# Patient Record
Sex: Female | Born: 1950 | Race: White | Hispanic: No | Smoking: Never smoker
Health system: Southern US, Community
[De-identification: ages and names within clinical notes are randomized; demographics above are authoritative.]

## PROBLEM LIST (undated history)

## (undated) DIAGNOSIS — R0602 Shortness of breath: Secondary | ICD-10-CM

## (undated) DIAGNOSIS — I10 Essential (primary) hypertension: Secondary | ICD-10-CM

## (undated) DIAGNOSIS — E7800 Pure hypercholesterolemia, unspecified: Secondary | ICD-10-CM

## (undated) DIAGNOSIS — J302 Other seasonal allergic rhinitis: Secondary | ICD-10-CM

## (undated) HISTORY — DX: Other seasonal allergic rhinitis: J30.2

## (undated) HISTORY — DX: Essential (primary) hypertension: I10

## (undated) HISTORY — DX: Pure hypercholesterolemia, unspecified: E78.00

## (undated) HISTORY — DX: Shortness of breath: R06.02

---

## 1999-06-09 LAB — PULMONARY FUNCTION TEST

## 2002-01-28 ENCOUNTER — Encounter: Payer: Self-pay | Admitting: Family Medicine

## 2002-01-28 ENCOUNTER — Ambulatory Visit (HOSPITAL_COMMUNITY): Admission: RE | Admit: 2002-01-28 | Discharge: 2002-01-28 | Payer: Self-pay | Admitting: Family Medicine

## 2002-02-04 ENCOUNTER — Encounter: Payer: Self-pay | Admitting: Family Medicine

## 2002-02-04 ENCOUNTER — Ambulatory Visit (HOSPITAL_COMMUNITY): Admission: RE | Admit: 2002-02-04 | Discharge: 2002-02-04 | Payer: Self-pay | Admitting: Family Medicine

## 2007-05-17 HISTORY — PX: OTHER SURGICAL HISTORY: SHX169

## 2007-10-25 ENCOUNTER — Ambulatory Visit (HOSPITAL_COMMUNITY): Admission: RE | Admit: 2007-10-25 | Discharge: 2007-10-25 | Payer: Self-pay | Admitting: Family Medicine

## 2008-01-22 ENCOUNTER — Ambulatory Visit: Payer: Self-pay | Admitting: Thoracic Surgery

## 2008-03-25 ENCOUNTER — Ambulatory Visit: Payer: Self-pay | Admitting: Thoracic Surgery

## 2008-03-25 ENCOUNTER — Inpatient Hospital Stay (HOSPITAL_COMMUNITY): Admission: RE | Admit: 2008-03-25 | Discharge: 2008-03-29 | Payer: Self-pay | Admitting: Thoracic Surgery

## 2008-03-25 ENCOUNTER — Encounter: Payer: Self-pay | Admitting: Thoracic Surgery

## 2008-04-03 ENCOUNTER — Encounter: Admission: RE | Admit: 2008-04-03 | Discharge: 2008-04-03 | Payer: Self-pay | Admitting: Thoracic Surgery

## 2008-04-03 ENCOUNTER — Ambulatory Visit: Payer: Self-pay | Admitting: Thoracic Surgery

## 2008-04-23 ENCOUNTER — Encounter: Admission: RE | Admit: 2008-04-23 | Discharge: 2008-04-23 | Payer: Self-pay | Admitting: Thoracic Surgery

## 2008-04-23 ENCOUNTER — Ambulatory Visit: Payer: Self-pay | Admitting: Thoracic Surgery

## 2008-06-04 ENCOUNTER — Ambulatory Visit: Payer: Self-pay | Admitting: Thoracic Surgery

## 2008-06-04 ENCOUNTER — Encounter: Admission: RE | Admit: 2008-06-04 | Discharge: 2008-06-04 | Payer: Self-pay | Admitting: Thoracic Surgery

## 2008-07-30 ENCOUNTER — Encounter: Admission: RE | Admit: 2008-07-30 | Discharge: 2008-07-30 | Payer: Self-pay | Admitting: Thoracic Surgery

## 2008-07-30 ENCOUNTER — Ambulatory Visit: Payer: Self-pay | Admitting: Thoracic Surgery

## 2008-11-26 ENCOUNTER — Ambulatory Visit: Payer: Self-pay | Admitting: Thoracic Surgery

## 2008-11-26 ENCOUNTER — Encounter: Admission: RE | Admit: 2008-11-26 | Discharge: 2008-11-26 | Payer: Self-pay | Admitting: Thoracic Surgery

## 2009-01-18 ENCOUNTER — Ambulatory Visit: Payer: Self-pay | Admitting: Specialist

## 2009-01-20 IMAGING — CR DG CHEST 2V
2 series · 2 of 2 positions shown · non-contrast
Comparison: 04/23/2008

CLINICAL DATA: Shortness of breath, pericardial cyst resection

CHEST - 2 VIEW

[w chest pa]
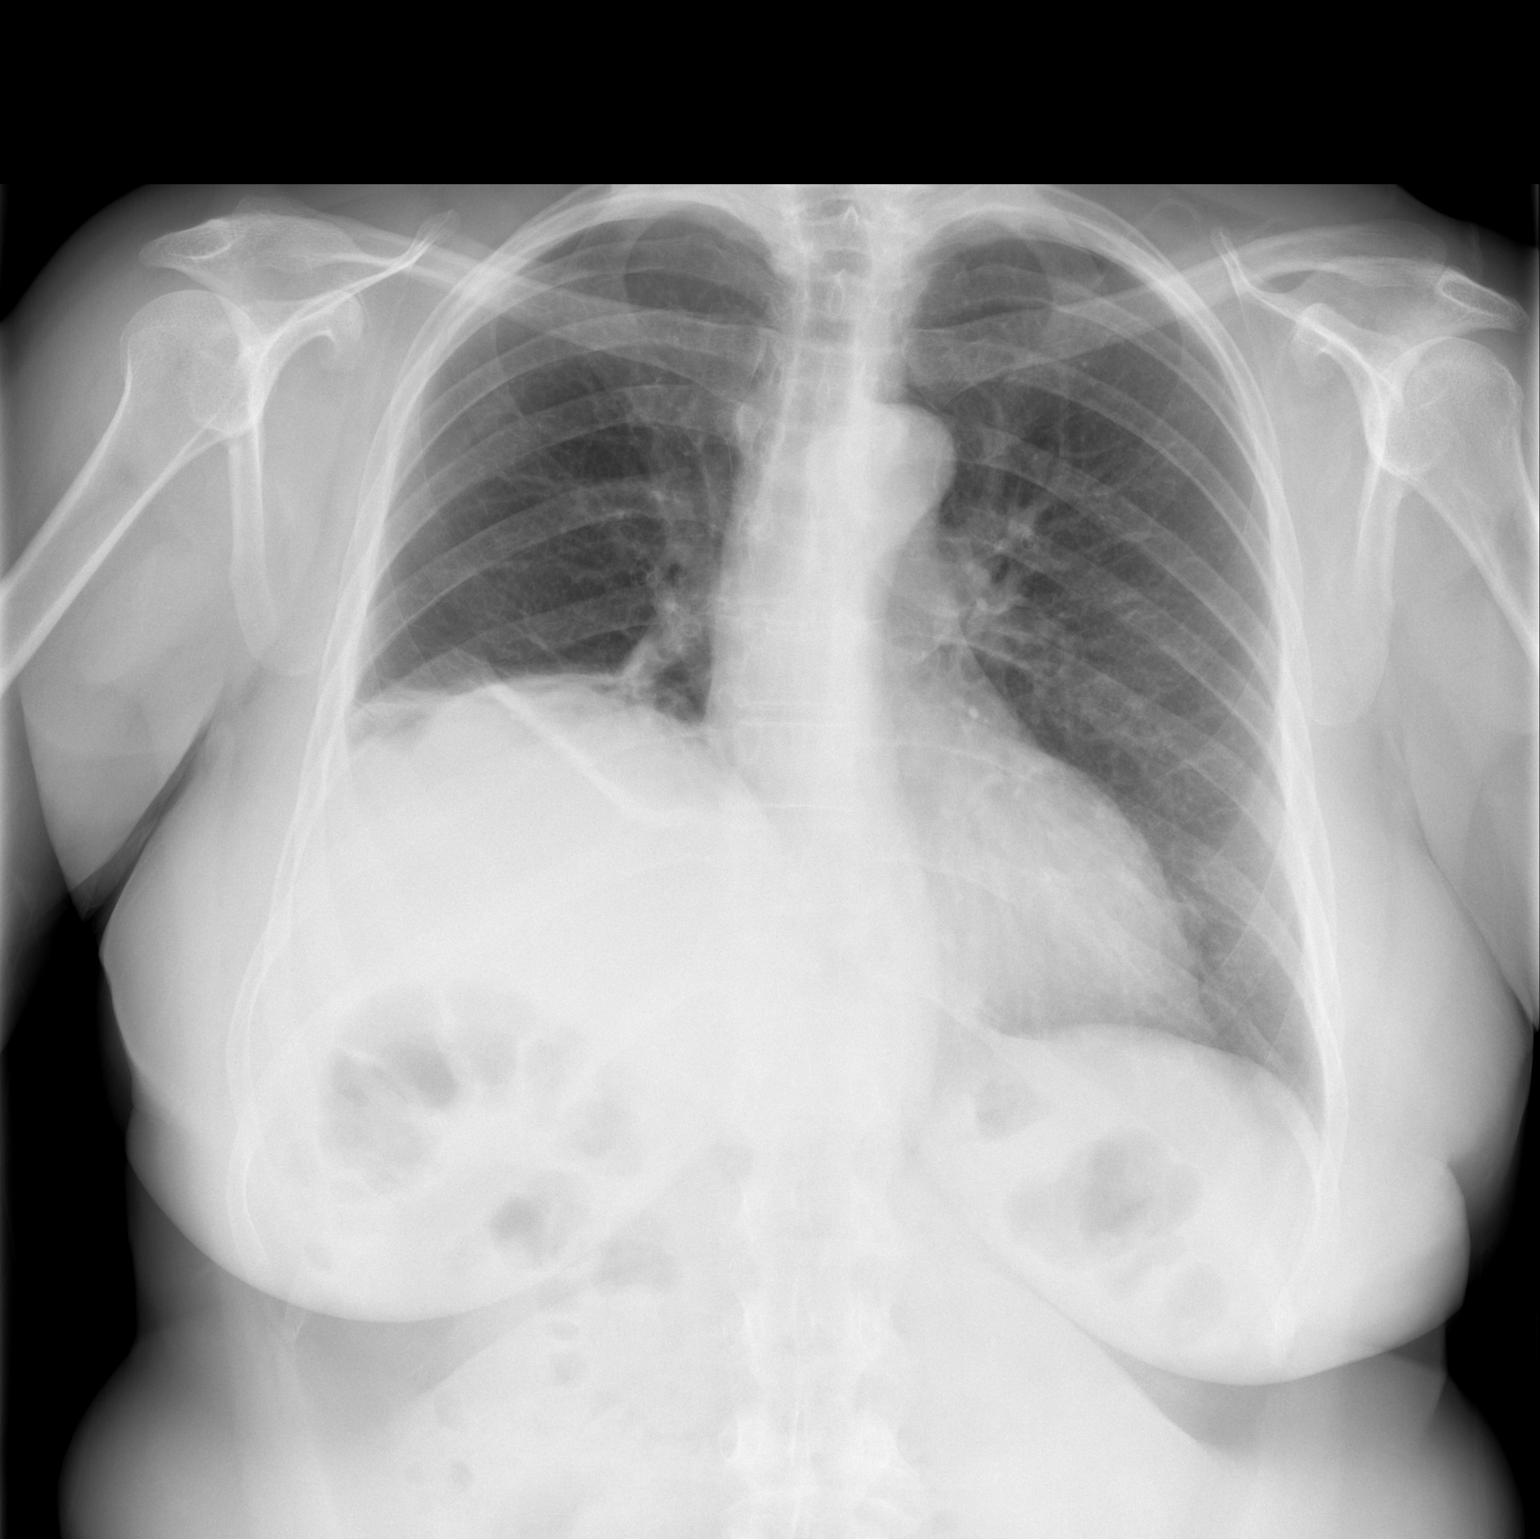

[w chest lat]
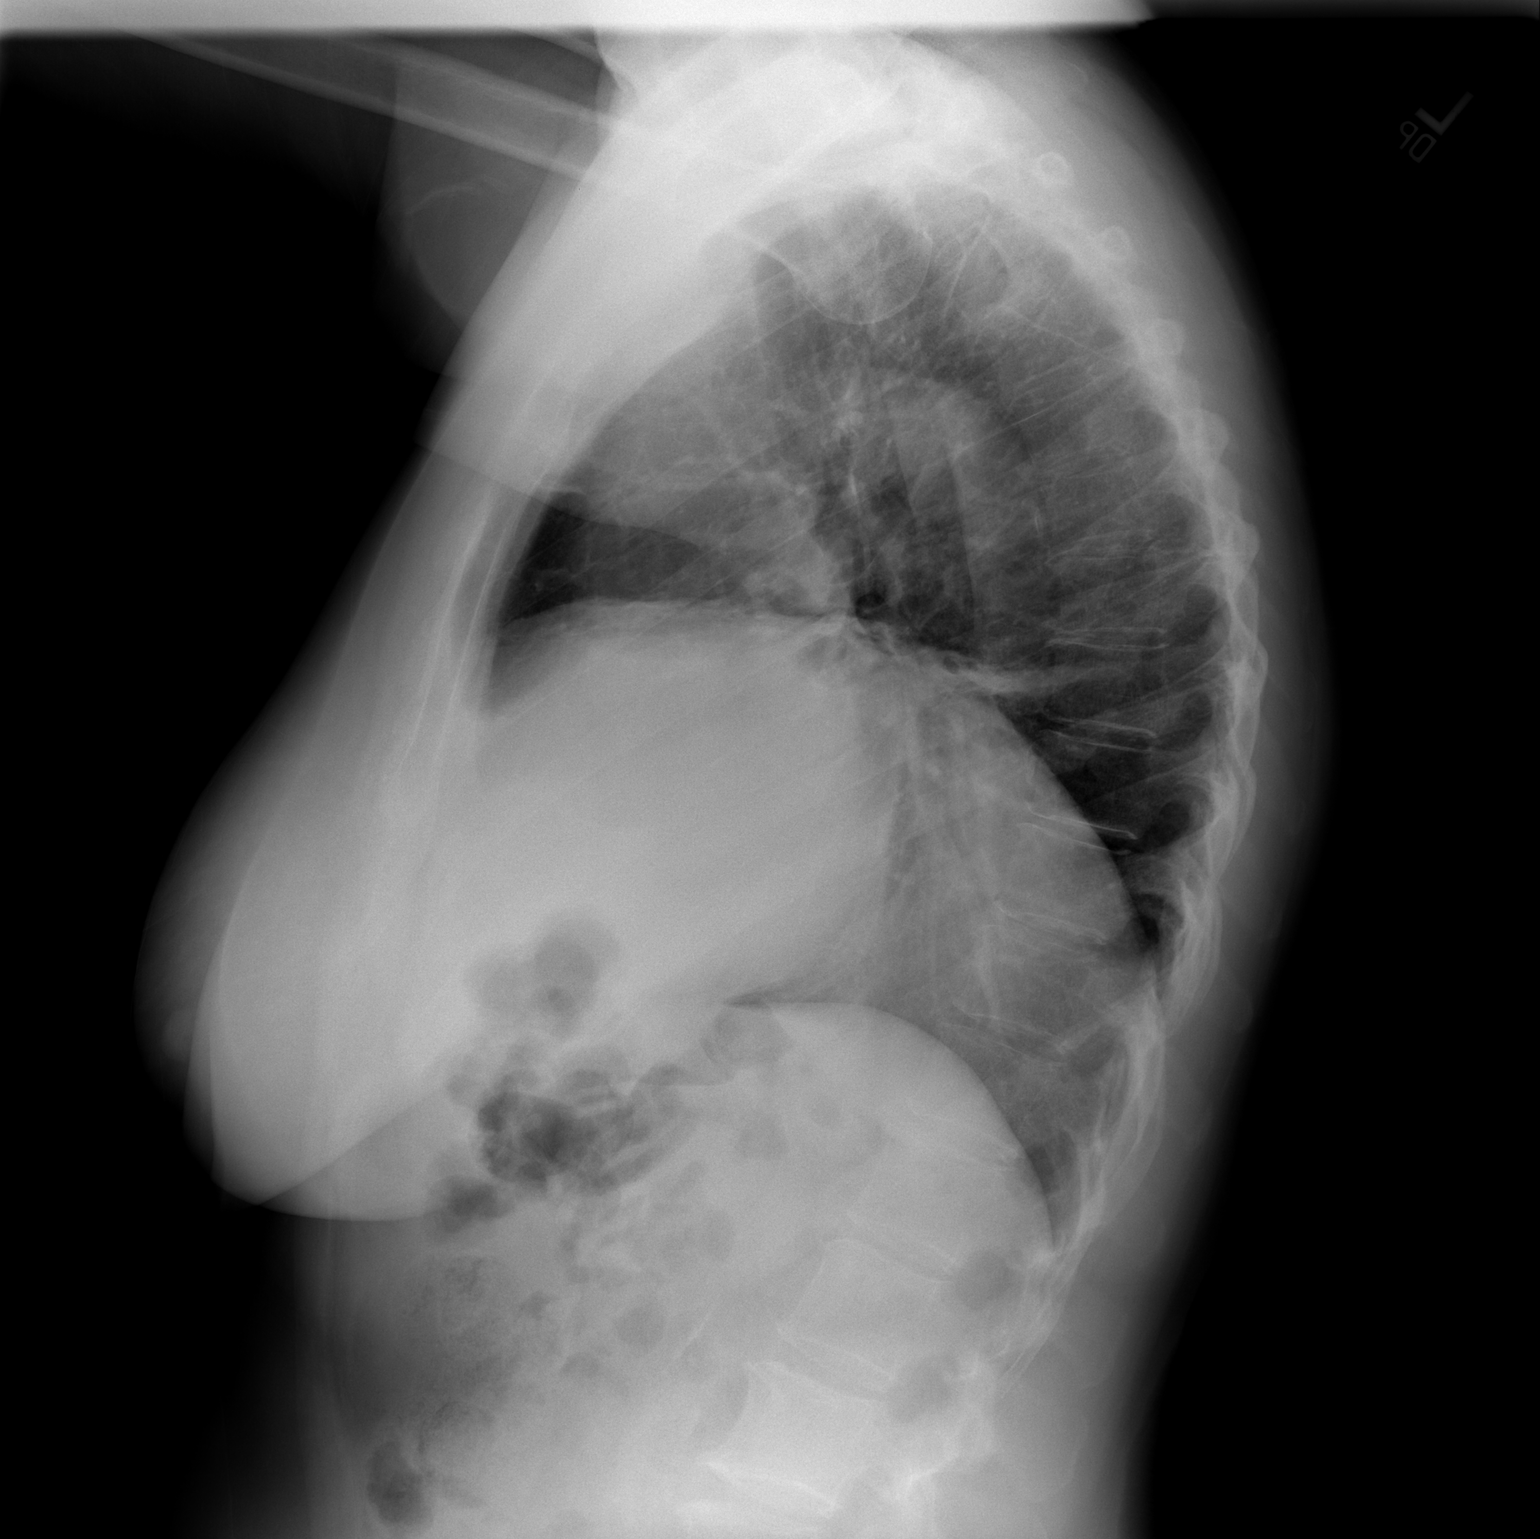

[2 of 2 positions shown; findings below may reference images not displayed]

FINDINGS: Heart size is normal.  Increased plate-like right lower
lobe atelectasis and trace right effusion/ pleural thickening are
noted.  The left lung is clear.  Heart size is normal.  No new
finding otherwise.
IMPRESSION: Increased plate-like right lower lobe atelectasis and trace right
effusion/pleural thickening.

## 2009-02-17 ENCOUNTER — Ambulatory Visit: Payer: Self-pay | Admitting: Internal Medicine

## 2009-02-19 ENCOUNTER — Ambulatory Visit: Payer: Self-pay | Admitting: Internal Medicine

## 2009-02-23 ENCOUNTER — Ambulatory Visit: Payer: Self-pay | Admitting: Internal Medicine

## 2009-02-26 ENCOUNTER — Ambulatory Visit: Payer: Self-pay | Admitting: Internal Medicine

## 2009-03-03 ENCOUNTER — Encounter: Payer: Self-pay | Admitting: Internal Medicine

## 2009-03-16 ENCOUNTER — Encounter: Payer: Self-pay | Admitting: Internal Medicine

## 2009-04-21 ENCOUNTER — Ambulatory Visit: Payer: Self-pay | Admitting: Internal Medicine

## 2009-04-28 ENCOUNTER — Ambulatory Visit: Payer: Self-pay | Admitting: Internal Medicine

## 2009-05-05 ENCOUNTER — Ambulatory Visit: Payer: Self-pay | Admitting: Internal Medicine

## 2009-05-19 ENCOUNTER — Ambulatory Visit: Payer: Self-pay | Admitting: Internal Medicine

## 2009-05-25 ENCOUNTER — Ambulatory Visit: Payer: Self-pay | Admitting: Internal Medicine

## 2009-06-01 ENCOUNTER — Ambulatory Visit: Payer: Self-pay | Admitting: Internal Medicine

## 2009-06-03 ENCOUNTER — Ambulatory Visit: Payer: Self-pay | Admitting: Thoracic Surgery

## 2009-06-03 ENCOUNTER — Encounter: Admission: RE | Admit: 2009-06-03 | Discharge: 2009-06-03 | Payer: Self-pay | Admitting: Thoracic Surgery

## 2009-09-05 ENCOUNTER — Emergency Department (HOSPITAL_COMMUNITY): Admission: EM | Admit: 2009-09-05 | Discharge: 2009-09-05 | Payer: Self-pay | Admitting: Emergency Medicine

## 2009-09-25 ENCOUNTER — Ambulatory Visit (HOSPITAL_COMMUNITY): Admission: RE | Admit: 2009-09-25 | Discharge: 2009-09-25 | Payer: Self-pay | Admitting: General Surgery

## 2009-12-02 ENCOUNTER — Ambulatory Visit: Payer: Self-pay | Admitting: Thoracic Surgery

## 2009-12-02 ENCOUNTER — Encounter: Admission: RE | Admit: 2009-12-02 | Discharge: 2009-12-02 | Payer: Self-pay | Admitting: Thoracic Surgery

## 2010-06-06 ENCOUNTER — Encounter: Payer: Self-pay | Admitting: Thoracic Surgery

## 2010-09-28 NOTE — Letter (Signed)
July 30, 2008   Thereasa Solo. Little, MD  [address], [address]  [place], [place] [address]   Re:  Alexandria Peters, Alexandria Peters              DOB:  02/28/1951   Dear Mindi Junker,   I saw the patient back today for followup.  She knows I was worried  about her feeling nerve palsy and causing elevation of her right  diaphragm and she comes today and there still some plate-like  atelectasis on chest x-ray and elevated right diaphragm, but may be not  as much as it was before.  Symptomatically, she is slightly improved  with less shortness of breath and has started an exercise program.  Her  blood pressure is 146/94, pulse 83, respirations 18, and sats are 95%.  I will plan to see her back again in 4 months with another chest x-ray  and hope she continues to improve.   Ines Bloomer, M.D.  Electronically Signed   DPB/MEDQ  D:  07/30/2008  T:  07/30/2008  Job:  [id number]   cc:   Carola Frost, D.D.S.

## 2010-09-28 NOTE — Letter (Signed)
April 23, 2008   Thereasa Solo. Little, MD  [address], [address]  [place], [place] [address]   Re:  Alexandria Peters, Alexandria Peters              DOB:  [birth year]   Dear Dr. Clarene [facility]:   I saw the patient back today.  Her incisions are well healed after her  excision of a pericardial cyst.  The right diaphragm is still elevated,  but she is breathing better and I think, it is actually may be the right  diaphragm, which has improved somewhat.  Whatever it is, we will plan to  see her back again and her lungs were also clear to auscultation and  percussion.  Her blood pressure is 148/93, pulse 94, respirations 18,  and sats were 96%.  I will see her back again in 6 weeks with another  chest x-ray.   Ines Bloomer, M.D.  Electronically Signed   DPB/MEDQ  D:  04/23/2008  T:  04/23/2008  Job:  [id number]
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
## 2010-09-28 NOTE — Op Note (Signed)
Alexandria Peters, Alexandria Peters              ACCOUNT NO.:  [id number]   MEDICAL RECORD NO.:  [id number]          PATIENT TYPE:  INP   LOCATION:  3308                         FACILITY:  [facility]   PHYSICIAN:  Ines Bloomer, M.D. DATE OF BIRTH:  15-Apr-1951   DATE OF PROCEDURE:  03/25/2008  DATE OF DISCHARGE:                               OPERATIVE REPORT   PREOPERATIVE DIAGNOSIS:  Right pericardial cyst.   POSTOPERATIVE DIAGNOSIS:  Right pericardial cyst.   OPERATION PERFORMED:  Right video-assisted thoracic surgery, excision of  pericardial cyst.   SURGEON:  Ines Bloomer, MD   FIRST ASSISTANT:  Doree Fudge, PA-C   ANESTHESIA:  General anesthesia.   PROCEDURE:  After percutaneous insertion of all monitoring lines, the  patient underwent general anesthesia.  Dual-lumen tube was inserted.  The patient was turned to the right lateral thoracotomy position.  The  right lung was deflated.  Trocar site was made at the eighth intercostal  space at the posterior axillary line and trocar was inserted.  Another  trocar was made at the sixth intercostal space at the anterior axillary  line, another trocar was inserted.  Pericardial cyst was seen in the  costophrenic angle and pictures were taken of this.  Third trocar site  was made in the mid axillary line at the sixth intercostal space.  This  was just a 5-mm trocar and a grasper was inserted.  We were able to  retract the lung laterally and expose the cyst and then coming in with a  30-degree camera posterior trocar site, the section showed the  pericardial cyst through the mid trocar site.  This was enlarged  approximately 2.5 to 3 cm.  The cyst was dissected out with  electrocautery, grasping with a Kaiser ring forceps and pulling up.  Dissection was carried down to the base of this and the cyst was  excised.  A lot of epicardial fat was also excised around the cyst as  the cyst was encased with lot of epicardial fat.  After the cyst  had  been excised, a #28 right-angled chest tube was inserted and sutured in  place with 0 silk of single On-Q was inserted in the usual fashion.  A  Marcaine block was done in the usual fashion.  The chest tube was  sutured in place with 0 silk.  The other chest tubes were closed with 3-  0 Vicryl and Dermabond for the skin.  The patient was returned to the  recovery room in stable condition.      Ines Bloomer, M.D.  Electronically Signed     DPB/MEDQ  D:  03/25/2008  T:  03/25/2008  Job:  [id number]   cc:   Thereasa Solo. Little, M.D.

## 2010-09-28 NOTE — Letter (Signed)
December 02, 2009   Thereasa Solo. Little, M.D.  [address], [address]  [place], [place] [address]   Re:  Alexandria Peters, Alexandria Peters              DOB:  [birth year]   Dear Gaspar Garbe,   I saw the patient back for followup.  There is no evidence of recurrence  of her pericardial cyst.  Her right diaphragm is still elevated but may  be little less than before.  She is no more short of breath and  everything looks stable.  Her lungs were clear bilaterally.  We will  plan to see her again in 1 year with another chest x-ray for further  followup.  Her blood pressure was 148/85, pulse 62, respirations 18,  saturations were 100%.   Alexandria Peters, M.D.  Electronically Signed   DPB/MEDQ  D:  12/02/2009  T:  12/03/2009  Job:  [id number]   cc:   Esau Grew, MD

## 2010-09-28 NOTE — Discharge Summary (Signed)
Alexandria Peters, Alexandria Peters              ACCOUNT NO.:  [id number]   MEDICAL RECORD NO.:  [id number]          PATIENT TYPE:  INP   LOCATION:  3308                         FACILITY:  [facility]   PHYSICIAN:  Ines Bloomer, M.D. DATE OF BIRTH:  12-04-1950   DATE OF ADMISSION:  03/25/2008  DATE OF DISCHARGE:  03/29/2008                               DISCHARGE SUMMARY   ADMITTING DIAGNOSES:  1. Right pericardial cyst.  2. History of asbestosis.   DISCHARGE DIAGNOSES:  1. Right pericardial cyst.  2. History of asbestosis.   PROCEDURE:  Right video-assisted thoracoscopy, excision of pericardial  cyst by Dr. Edwyna Shell on March 25, 2008.  Pathology results of the  pericardial cyst, simple cystic mesothelial-line fiber adipose tissue  with chronic inflammation, malignant features not identified.   HISTORY OF PRESENTING ILLNESS:  This is a 60 year old Caucasian female  with a past medical history of asbestos exposure (since 1976) has been  followed closely by Dr. Clarene Duke.  A CAT scan of the chest done on 2001  showed a 3.6-centimeter pericardial density in the right heart border.  A more recent scan of the CT of the chest showed an increase in size of  this pericardial density.  An echocardiogram was negative as well as a  stress test.  Pulmonary function test showed an FVC of 3.4 and FEV-1 of  3.53 with an effusion capacity of 100%.  The patient was seen in  consultation in the office by Dr. Edwyna Shell on March 18, 2008.  The  patient was admitted to [facility] on March 25, 2008 to undergo a  right VATS and excision of her right pericardial cyst.   BRIEF HOSPITAL COURSE STAY:  The patient remained afebrile and  hemodynamically stable.  She was found to have hypokalemia (potassium  3.6 on postop day #1), potassium was supplemented accordingly.  The  patient was not noted to have an air leak from her chest tube.  She was  taken off suction.  Chest x-ray on postop day #2 showed a right  pneumothorax approximately 10-15% as well as an elevated right  hemidiaphragm.  The patient On-Q was discontinued on this date.  Chest  tube remained.  Followup x-ray on March 28, 2008 revealed a decrease  of the right pneumothorax to less than 5% as well as right base  atelectasis and again right hemidiaphragm elevation.  Currently, the  patient again is afebrile and hemodynamically stable.  She is tolerating  a regular diet.   PHYSICAL EXAMINATION:  CARDIOVASCULAR:  Regular rate and rhythm.  PULMONARY:  Mostly clear to auscultation bilaterally.  Slight diminished  at the right base.  ABDOMEN:  Soft, nontender.  Bowel sounds present.  Right chest wound is  clean, dry and continuing to heal.  The patient is going to have her  Foley removed today as well as her chest tube.  An x-ray was taken  immediately after removal of the chest tube followed by another chest x-  ray in the morning provided she remains afebrile and the chest x-ray  shows no new findings.  The patient will be discharged  on March 29, 2008.   Latest laboratory studies reveal last CBC done March 27, 2008, H&H  11.9 and 34.0 respectively, white count elevated to 100 and platelet  count 162,000.  CMP done on this date as well potassium 3.9.  BUN and  creatinine were 6 and 0.63 respectively.  LFTs were all within normal  limits.  Last chest x-ray done today as previously stated again showed a  decrease in the size of the right pneumothorax, (less than 5%), right  base atelectasis and elevation of the right hemidiaphragm.   DISCHARGE INSTRUCTIONS:  1. The patient is to remain on a regular diet.  2. She may shower.  3. She is not to lift or drive for 2 weeks.  4. She is to continue with her breathing exercise daily.  5. She is to walk everyday and increase her frequency and duration as      tolerated.  6. She has a followup appointment to see Dr. Edwyna Shell on April 03, 2008 at 4:30 p.m.  Prior to this  appointment, a chest x-ray will be      obtained.  Chest tube sutures will be removed at this office visit.   DISCHARGE MEDICATIONS:  1. Actifed p.o. as directed p.r.n. nasal congestion.  2. Percocet 5/325 mg one to two tablets p.o. q. 4-6 hours p.r.n. pain.      Alexandria Peters, [place]      Ines Bloomer, M.D.  Electronically Signed    DZ/MEDQ  D:  03/28/2008  T:  03/28/2008  Job:  [id number]   cc:   Dr. Clarene Duke

## 2010-09-28 NOTE — Letter (Signed)
January 22, 2008   Julieanne Manson, MD  [id number]  [address]  [place], [place]  [address]   Re:  Alexandria Peters, BORROR              DOB:  Mar 06, 1951   Dear Dr. Clarene Duke:    This 60 year old female was referred for evaluation of a pericardial  cyst.  She was initially seen by Dr. Clarene Duke.  She worked at [employer]  since 1976 and has frequent checks for asbestosis.  A CT scan in 2001  showed a pericardial density of 3.6 cm in the right heart border and  repeat in 2009 showed this increased in size to 6.3 cm consistent with  pericardial cyst.  She had no chest pain, discomfort, or questionable  palpitations.  An echocardiogram was negative and a stress test was  negative.  She has never smoked and no fever, chills, or excessive  sputum.  She has no cardiac risk factors.  Pulmonary function test  showed an FVC of the 3.4 with an FEV-1 of 3.53 and diffusion capacity of  100%.  She has had no fever, chills, or excessive sputum.   She takes Actifed for nasal congestion.   She is allergic to sulfa.   Her medical problems include asbestosis.   REVIEW OF SYSTEMS:  Her weight has been stable.  CARDIAC:  See history of present illness.  PULMONARY:  No nausea, no hemoptysis, fever, chills, or excessive  sputum.  GU:  No kidney disease, dysuria, or frequent urination.  GI:  No nausea, vomiting, constipation, diarrhea, and no GERD.  MUSCULOSKELETAL:  No arthritis or rash.  NEUROLOGICAL:  No dizziness, headaches, blackouts, or seizures.  NEUROLOGICAL:  No problems with bleeding, clotting disorders, or anemia.  PSYCHIATRIC:  No nervousness or depression.  VASCULAR:  No claudication, DVT, TIAs.   PHYSICAL EXAMINATION:  Vital Signs:  Her blood pressure was 150/97,  pulse 77, respirations 18, and sats were 99%.  Head, Eyes, Ears, Nose,  and Throat:  Unremarkable.  Neck:  Supple without thyromegaly.  There is  no supraclavicular or axillary adenopathy.  Chest:  Clear to  auscultation and  percussion.  Heart:  Regular sinus rhythm.  No murmurs.  Abdomen:  Soft.  No hepatosplenomegaly.  Pulses are 2+.  There is no  clubbing or edema.  Neurological:  She is oriented x3.  Sensory and  motor are intact.   I have discussed the risks of an enlarging pericardial cyst.  The risks  of infection, questionable bleeding, and inflammation.  I have  recommended she have a VATS removal of this.  She agrees to the surgery.  Unfortunately her CT scans were not sent with her, so I have to review  them before making a final decision, but we will tentatively plan VATS  removal of her pericardial cyst in mid October 2009.   Ines Bloomer, M.D.  Electronically Signed   DPB/MEDQ  D:  01/22/2008  T:  01/23/2008  Job:  [id number]

## 2010-09-28 NOTE — Letter (Signed)
November 26, 2008   Kirk Ruths, MD  [address]  [place], [place] [address]   Re:  Alexandria Peters, Alexandria Peters              DOB:  1950/11/22   Dear Dr. Regino Schultze:   I saw the patient back today.  She is now about 6 months since resection  of her pericardial cyst.  She is breathing better.  Her lungs are clear  to auscultation and percussion.  Her blood pressure is 147/90, pulse 74,  respirations 18, sats were 98%.  We still see an elevation of the right  diaphragm, so we will have to continue to watch this, but  symptomatically she is much improved.  I will see her back again in 6  months with a chest x-ray.   Sincerely,   Ines Bloomer, M.D.  Electronically Signed   DPB/MEDQ  D:  11/26/2008  T:  11/27/2008  Job:  [id number]   cc:   Thereasa Solo. Little, M.D.

## 2010-09-28 NOTE — Letter (Signed)
April 03, 2008   Thereasa Solo. Little, MD  [address]  [place], [place] [address]   Re:  BRITANNY, MARKSBERRY              DOB:  12-Feb-1951   Dear Roxana Hires,   The patient returned today.  Her chest x-ray still shows elevation of  her right diaphragm.  I am worried about a possible clinic nerve  dysfunction.  She is really asymptomatic.  Her blood pressure is 147/94,  pulse 99, respirations 18, and sats are 94%.  We will plan to return to  work on April 14, 2008.  Today, her incision is  well healed except  for 1 chest tube site, it had some erythema on it, but should heal  without a problem.  Hopefully, she will gradually increase her  activities and I will plan to see her back again in 3 weeks with another  chest x-ray.   Ines Bloomer, M.D.  Electronically Signed   DPB/MEDQ  D:  04/03/2008  T:  04/04/2008  Job:  [id number]

## 2010-09-28 NOTE — H&P (Signed)
NAMEJONTE, Alexandria Peters              ACCOUNT NO.:  [id number]   MEDICAL RECORD NO.:  [id number]          PATIENT TYPE:  INP   LOCATION:  NA                           FACILITY:  [facility]   PHYSICIAN:  Ines Bloomer, M.D. DATE OF BIRTH:  1950-05-24   DATE OF ADMISSION:  DATE OF DISCHARGE:                              HISTORY & PHYSICAL   CHIEF COMPLAINT:  Right heart density.   This is a 60 year old patient who has been seen by Dr. Julieanne Manson and  was being followed since 1976, for asbestos because of working with  [employer].  A CT scan in 2001, showed a 3.6 pericardial density in the right  heart border and this was repeated in 2009, that showed increase in size  to 6.3 consistent with a pericardial cyst.  She has no chest pain or  discomfort.  An echocardiogram was negative and a stress test was  negative.  She has never smoked, had no fever, chills, or excessive  sputum.  She has no cardiac risk factors.  Pulmonary function tests  showed an FVC of 3.4 with an FEV1 of 3.53 and an effusion capacity of  100%.   MEDICAL HISTORY:  She takes Actifed for nasal congestion.   She is allergic to SULFA.   She said she has had medical problems because of asbestos exposure.   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  Does not smoke or drink.   REVIEW OF SYSTEMS:  VITAL SIGNS:  She is 178 pounds.  She is 5 feet 6  inches.  GENERAL:  Her weight has been stable.  CARDIAC:  She has shortness of breath with exertion.  PULMONARY:  She had a productive cough.  GI:  No nausea, vomiting, constipation, or diarrhea.  GU:  No kidney disease, frequent urination.  VASCULAR:  No claudication, DVT, or TIAs.  NEUROLOGIC:  No dizziness, headaches, blackouts, or seizures.  MUSCULOSKELETAL:  No arthritis.  PSYCHIATRIC:  No psychiatric illness.  ENT:  No change in her eyesight or hearing.  HEMATOLOGIC:  No problems with bleeding, clotting disorders, or anemia.   PHYSICAL EXAMINATION:  GENERAL:  She is a well-developed Caucasian  female in no acute distress.  VITAL SIGNS:  Her blood pressure was 150/97, pulse 77, respirations 18,  sats were 99%.  HEENT:  Head, eyes, ears, nose, and throat; head is atraumatic.  Eyes,  pupils are equal, round, and reactive to light and accommodation.  Extraocular movements are normal.  Ears, tympanic membranes are intact.  Nose, there is no septal deviation.  Throat without lesion.  NECK:  Supple without thyromegaly.  There is no supraclavicular or  axillary adenopathy.  CHEST:  Clear to percussion.  HEART:  Regular sinus rhythm.  No murmurs.  ABDOMEN:  Soft.  No hepatosplenomegaly.  EXTREMITIES:  Pulses are 2+.  There is no clubbing or edema.  NEUROLOGIC:  He is oriented x3.  Sensory and motor are intact.  Cranial  nerves are intact.   IMPRESSION:  1. Enlarging pericardial cyst.  2. Allergies.   PLAN:  VATS  removal of right pericardial cyst.      Ines Bloomer, M.D.  Electronically Signed     DPB/MEDQ  D:  03/18/2008  T:  03/19/2008  Job:  [id number]

## 2010-09-28 NOTE — Letter (Signed)
June 04, 2008   Thereasa Solo. Little, MD  [address]  [address]  [place], [place] [address]   Re:  Alexandria Peters, Alexandria Peters              DOB:  13-Jul-1950   Dear Mindi Junker:   I saw the patient back today.  Her incisions are well healed.  As you  know, we did her surgery in mid November.  She comes back today and her  chest x-ray still shows an elevated right diaphragm, so I feel she prior  may had some possible injury to her right phrenic nerve, which we  discussed today.  Chest x-ray showed some right lower lobe atelectasis.  She has no pain, but does get shortness of breath with exertion.  She  will start an exercise program and I plan to see her back again in 2  months and hopefully there will be some improvement.  Her blood pressure  was 140/94, pulse 96, respirations 18, and sats were 96%.   Sincerely,   Ines Bloomer, M.D.  Electronically Signed   DPB/MEDQ  D:  06/04/2008  T:  06/04/2008  Job:  [id number]

## 2010-11-30 ENCOUNTER — Other Ambulatory Visit: Payer: Self-pay | Admitting: Thoracic Surgery

## 2010-11-30 DIAGNOSIS — J90 Pleural effusion, not elsewhere classified: Secondary | ICD-10-CM

## 2010-12-01 ENCOUNTER — Ambulatory Visit
Admission: RE | Admit: 2010-12-01 | Discharge: 2010-12-01 | Disposition: A | Discharge status: Home / Self Care | Payer: 59 | Source: Ambulatory Visit | Attending: Thoracic Surgery | Admitting: Thoracic Surgery

## 2010-12-01 ENCOUNTER — Ambulatory Visit (INDEPENDENT_AMBULATORY_CARE_PROVIDER_SITE_OTHER): Payer: 59 | Admitting: Thoracic Surgery

## 2010-12-01 DIAGNOSIS — Q791 Other congenital malformations of diaphragm: Secondary | ICD-10-CM

## 2010-12-01 DIAGNOSIS — J90 Pleural effusion, not elsewhere classified: Secondary | ICD-10-CM

## 2010-12-01 NOTE — Assessment & Plan Note (Signed)
OFFICE VISIT  AMANEE, IACOVELLI DOB:  1950/06/03                                        December 01, 2010 CHART #:  [id number]  I saw the patient back today.  Her blood pressure is 131/89, pulse 80, respirations 18, and sats were 99%.  Chest x-ray still shows some elevation of a right diaphragm.  This has not changed from the last year or two.  It is somewhat little more than what it was prior to removing the pericardial cyst, but it is down stabilized, she probably does have some chronic nerve dysfunction, but she has some short of breath with activity but this fully had not changed.  I will be happy to see her again if she has any worsening problems regarding her elevated right diaphragm, but I would imagine that using the right side once stabilizes, there is using a long-term problems.  I appreciate the opportunity of seeing the patient.  Ines Bloomer, M.D. Electronically Signed  DPB/MEDQ  D:  12/01/2010  T:  12/01/2010  Job:  [id number]  cc:   Truddie Coco Dr. Regino Schultze

## 2011-02-15 LAB — URINALYSIS, ROUTINE W REFLEX MICROSCOPIC
Bilirubin Urine: NEGATIVE
Glucose, UA: NEGATIVE
Ketones, ur: NEGATIVE
Nitrite: NEGATIVE
Protein, ur: NEGATIVE
Specific Gravity, Urine: 1.005
Urobilinogen, UA: 0.2

## 2011-02-15 LAB — CBC
HCT: 34.8 — ABNORMAL LOW
HCT: 41.2
Hemoglobin: 11.9 — ABNORMAL LOW
Hemoglobin: 14
MCHC: 34.1
MCV: 93.5
MCV: 93.6
MCV: 94.7
Platelets: 162
Platelets: 182
Platelets: 227
RBC: 3.68 — ABNORMAL LOW
RBC: 3.73 — ABNORMAL LOW
WBC: 8.1
WBC: 8.6

## 2011-02-15 LAB — COMPREHENSIVE METABOLIC PANEL
AST: 21
Albumin: 3 — ABNORMAL LOW
Alkaline Phosphatase: 51
Alkaline Phosphatase: 71
BUN: 6
CO2: 20
CO2: 26
Calcium: 9.4
Chloride: 101
Chloride: 105
Creatinine, Ser: 0.75
GFR calc Af Amer: >60
GFR calc non Af Amer: >60
Glucose, Bld: 102 — ABNORMAL HIGH
Potassium: 3.9
Sodium: 136
Total Bilirubin: 0.5

## 2011-02-15 LAB — BLOOD GAS, ARTERIAL
Acid-base deficit: 0.4
Bicarbonate: 23.5
FIO2: 0.21
O2 Saturation: 98.7
Patient temperature: 98.6
pCO2 arterial: 37
pO2, Arterial: 106 — ABNORMAL HIGH

## 2011-02-15 LAB — BASIC METABOLIC PANEL
BUN: 6
Calcium: 8.2 — ABNORMAL LOW
Creatinine, Ser: 0.73
GFR calc Af Amer: >60
GFR calc non Af Amer: >60

## 2011-05-17 DIAGNOSIS — E7800 Pure hypercholesterolemia, unspecified: Secondary | ICD-10-CM

## 2011-05-17 HISTORY — DX: Pure hypercholesterolemia, unspecified: E78.00

## 2013-10-21 ENCOUNTER — Other Ambulatory Visit (HOSPITAL_COMMUNITY): Payer: Self-pay | Admitting: Family Medicine

## 2013-10-21 DIAGNOSIS — R9389 Abnormal findings on diagnostic imaging of other specified body structures: Secondary | ICD-10-CM

## 2013-10-22 ENCOUNTER — Ambulatory Visit (HOSPITAL_COMMUNITY)
Admission: RE | Admit: 2013-10-22 | Discharge: 2013-10-22 | Disposition: A | Discharge status: Home / Self Care | Payer: 59 | Source: Ambulatory Visit | Attending: Family Medicine | Admitting: Family Medicine

## 2013-10-22 DIAGNOSIS — E041 Nontoxic single thyroid nodule: Secondary | ICD-10-CM | POA: Insufficient documentation

## 2013-10-22 DIAGNOSIS — R9389 Abnormal findings on diagnostic imaging of other specified body structures: Secondary | ICD-10-CM

## 2014-11-28 ENCOUNTER — Encounter: Payer: Self-pay | Admitting: *Deleted

## 2014-12-04 ENCOUNTER — Encounter: Payer: Self-pay | Admitting: Cardiology

## 2014-12-05 ENCOUNTER — Encounter: Payer: Self-pay | Admitting: Cardiovascular Disease

## 2018-03-27 DIAGNOSIS — Z23 Encounter for immunization: Secondary | ICD-10-CM | POA: Diagnosis not present

## 2018-05-10 DIAGNOSIS — Z1231 Encounter for screening mammogram for malignant neoplasm of breast: Secondary | ICD-10-CM | POA: Diagnosis not present

## 2018-05-31 DIAGNOSIS — M818 Other osteoporosis without current pathological fracture: Secondary | ICD-10-CM | POA: Diagnosis not present

## 2018-05-31 DIAGNOSIS — Z780 Asymptomatic menopausal state: Secondary | ICD-10-CM | POA: Diagnosis not present

## 2018-05-31 DIAGNOSIS — Z13820 Encounter for screening for osteoporosis: Secondary | ICD-10-CM | POA: Diagnosis not present

## 2018-07-11 DIAGNOSIS — L814 Other melanin hyperpigmentation: Secondary | ICD-10-CM | POA: Diagnosis not present

## 2018-07-11 DIAGNOSIS — L821 Other seborrheic keratosis: Secondary | ICD-10-CM | POA: Diagnosis not present

## 2018-07-11 DIAGNOSIS — Z85828 Personal history of other malignant neoplasm of skin: Secondary | ICD-10-CM | POA: Diagnosis not present

## 2018-07-11 DIAGNOSIS — D225 Melanocytic nevi of trunk: Secondary | ICD-10-CM | POA: Diagnosis not present

## 2018-07-12 DIAGNOSIS — Z0000 Encounter for general adult medical examination without abnormal findings: Secondary | ICD-10-CM | POA: Diagnosis not present

## 2018-07-12 DIAGNOSIS — I10 Essential (primary) hypertension: Secondary | ICD-10-CM | POA: Diagnosis not present

## 2018-07-12 DIAGNOSIS — Z1389 Encounter for screening for other disorder: Secondary | ICD-10-CM | POA: Diagnosis not present

## 2018-10-25 DIAGNOSIS — H10013 Acute follicular conjunctivitis, bilateral: Secondary | ICD-10-CM | POA: Diagnosis not present

## 2018-11-09 ENCOUNTER — Other Ambulatory Visit: Payer: Self-pay

## 2018-11-09 ENCOUNTER — Other Ambulatory Visit: Payer: Self-pay | Admitting: Internal Medicine

## 2018-11-09 DIAGNOSIS — Z20822 Contact with and (suspected) exposure to covid-19: Secondary | ICD-10-CM

## 2018-11-15 ENCOUNTER — Telehealth: Payer: Self-pay

## 2018-11-15 LAB — NOVEL CORONAVIRUS, NAA: SARS-CoV-2, NAA: DETECTED — AB

## 2018-11-15 NOTE — Telephone Encounter (Signed)
Message left to call back for lab results.

## 2019-04-02 DIAGNOSIS — Z136 Encounter for screening for cardiovascular disorders: Secondary | ICD-10-CM | POA: Diagnosis not present

## 2019-04-02 DIAGNOSIS — Z0000 Encounter for general adult medical examination without abnormal findings: Secondary | ICD-10-CM | POA: Diagnosis not present

## 2019-04-02 DIAGNOSIS — N762 Acute vulvitis: Secondary | ICD-10-CM | POA: Diagnosis not present

## 2019-04-02 DIAGNOSIS — N951 Menopausal and female climacteric states: Secondary | ICD-10-CM | POA: Diagnosis not present

## 2019-06-10 DIAGNOSIS — J019 Acute sinusitis, unspecified: Secondary | ICD-10-CM | POA: Diagnosis not present

## 2019-07-16 DIAGNOSIS — L578 Other skin changes due to chronic exposure to nonionizing radiation: Secondary | ICD-10-CM | POA: Diagnosis not present

## 2019-07-16 DIAGNOSIS — L814 Other melanin hyperpigmentation: Secondary | ICD-10-CM | POA: Diagnosis not present

## 2019-07-16 DIAGNOSIS — L821 Other seborrheic keratosis: Secondary | ICD-10-CM | POA: Diagnosis not present

## 2019-07-16 DIAGNOSIS — D225 Melanocytic nevi of trunk: Secondary | ICD-10-CM | POA: Diagnosis not present

## 2019-07-16 DIAGNOSIS — Z85828 Personal history of other malignant neoplasm of skin: Secondary | ICD-10-CM | POA: Diagnosis not present

## 2019-07-30 DIAGNOSIS — Z1231 Encounter for screening mammogram for malignant neoplasm of breast: Secondary | ICD-10-CM | POA: Diagnosis not present

## 2019-08-27 DIAGNOSIS — M7741 Metatarsalgia, right foot: Secondary | ICD-10-CM | POA: Diagnosis not present

## 2019-08-27 DIAGNOSIS — M79671 Pain in right foot: Secondary | ICD-10-CM | POA: Diagnosis not present

## 2019-09-03 DIAGNOSIS — J019 Acute sinusitis, unspecified: Secondary | ICD-10-CM | POA: Diagnosis not present

## 2019-10-08 DIAGNOSIS — Z0000 Encounter for general adult medical examination without abnormal findings: Secondary | ICD-10-CM | POA: Diagnosis not present

## 2019-10-09 ENCOUNTER — Encounter: Payer: Self-pay | Admitting: *Deleted

## 2019-10-09 DIAGNOSIS — Z0000 Encounter for general adult medical examination without abnormal findings: Secondary | ICD-10-CM | POA: Diagnosis not present

## 2019-10-09 DIAGNOSIS — Z1389 Encounter for screening for other disorder: Secondary | ICD-10-CM | POA: Diagnosis not present

## 2019-10-09 DIAGNOSIS — E782 Mixed hyperlipidemia: Secondary | ICD-10-CM | POA: Diagnosis not present

## 2019-11-28 ENCOUNTER — Encounter: Payer: Self-pay | Admitting: Cardiovascular Disease

## 2019-11-28 ENCOUNTER — Other Ambulatory Visit: Payer: Self-pay

## 2019-11-28 ENCOUNTER — Ambulatory Visit: Payer: Medicare Other | Admitting: Cardiovascular Disease

## 2019-11-28 VITALS — BP 122/68 | HR 77 | Ht 66.0 in | Wt 184.8 lb

## 2019-11-28 DIAGNOSIS — I2510 Atherosclerotic heart disease of native coronary artery without angina pectoris: Secondary | ICD-10-CM | POA: Diagnosis not present

## 2019-11-28 DIAGNOSIS — Z8774 Personal history of (corrected) congenital malformations of heart and circulatory system: Secondary | ICD-10-CM

## 2019-11-28 DIAGNOSIS — I10 Essential (primary) hypertension: Secondary | ICD-10-CM

## 2019-11-28 DIAGNOSIS — R079 Chest pain, unspecified: Secondary | ICD-10-CM | POA: Diagnosis not present

## 2019-11-28 NOTE — Patient Instructions (Signed)
Medication Instructions:  No changes *If you need a refill on your cardiac medications before your next appointment, please call your pharmacy*   Lab Work: None ordered If you have labs (blood work) drawn today and your tests are completely normal, you will receive your results only by: Marland Kitchen MyChart Message (if you have MyChart) OR . A paper copy in the mail If you have any lab test that is abnormal or we need to change your treatment, we will call you to review the results.   Testing/Procedures: Your physician has requested that you have an echocardiogram. Echocardiography is a painless test that uses sound waves to create images of your heart. It provides your doctor with information about the size and shape of your heart and how well your heart's chambers and valves are working. You may receive an ultrasound enhancing agent through an IV if needed to better visualize your heart during the echo.This procedure takes approximately one hour. There are no restrictions for this procedure. This will take place at the [address].   Your physician has requested that you have an exercise tolerance test. For further information please visit [email or web address]. Please also follow instruction sheet, as given. This will take place at [place], [address].  Do not drink or eat foods with caffeine for 24 hours before the test. (Chocolate, coffee, tea, or energy drinks)  If you use an inhaler, bring it with you to the test.  Do not smoke for 4 hours before the test.  Wear comfortable shoes and clothing.   Follow-Up: At [facility], you and your health needs are our priority.  As part of our continuing mission to provide you with exceptional heart care, we have created designated Provider Care Teams.  These Care Teams include your primary Cardiologist (physician) and Advanced Practice Providers (APPs -  Physician Assistants and Nurse Practitioners) who all work together to  provide you with the care you need, when you need it.  We recommend signing up for the patient portal called "MyChart".  Sign up information is provided on this After Visit Summary.  MyChart is used to connect with patients for Virtual Visits (Telemedicine).  Patients are able to view lab/test results, encounter notes, upcoming appointments, etc.  Non-urgent messages can be sent to your provider as well.   To learn more about what you can do with MyChart, go to [email or web address].    Your next appointment:   12 month(s)  The format for your next appointment:   In Person  Provider:   You may see Sanda Klein, MD or one of the following Advanced Practice Providers on your designated Care Team:    Almyra Deforest, PA-C  Fabian Sharp, PA-C or   Roby Lofts, [place]

## 2019-11-28 NOTE — Progress Notes (Addendum)
Cardiology Consult Note:    Date:  11/28/2019   ID:  [name], DOB 1951-04-27, MRN [id number]  PCP:  No primary care provider on file.  [facility] Cardiologist:  No primary care provider on file.  [facility] Electrophysiologist:  None   Referring MD: [name], PA-C   CC: Chest pain  Alexandria Peters is a 69 y.o. female who is being seen today for the evaluation of chest pain at the request of [name], PA-C.  History of Present Illness:    Alexandria Peters is a 69 y.o. 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   Alexandria Peters states she began to have chest pressure in the past 2 years since starting to take care of her mother. The pressure comes on at rest when she is feeling emotionally overwhelmed. She denies any exertional chest pressure or pain, palpitations, left arm pain, or SOB. On a daily basis, she is able to perform all her own ADLs without assistance, including sweeping, vacuuming, cooking. She is able to mow her lawn with a push mower without any chest pain. Alexandria Peters states she stays active daily. She denies any dizziness, headaches, weakness.   Only medication taken daily is Losartan. She denies any difficulty with this.   She endorses recent weight gain in the past year or so. Alexandria Peters admits to needing to work on her diet.   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 She denies any difficulty breathing associated with this.   History of asbestosis for which patient follows with Dr. Pearlie Oyster. 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   Past Medical History:  Diagnosis Date  . Elevated LDL cholesterol level 2013     mild  . Seasonal allergies   . SOB (shortness of breath)   . Systemic hypertension    Current Medications: Current Meds  Medication Sig  . Cetirizine HCl (ZYRTEC ALLERGY) 10 MG CAPS Zyrtec 10 mg capsule  Take by oral route.  . ergocalciferol (VITAMIN D2) 1.25 MG (50000 UT) capsule ergocalciferol (vitamin D2) 1,250 mcg (50,000 unit) capsule  TAKE 1 CAPSULE ONCE WEEK FOR 8 WEEKS  . losartan (COZAAR) 100 MG tablet Take 100 mg by mouth daily.    Allergies:   Sulfa antibiotics   Social History   Socioeconomic History  . Marital status: Divorced    Spouse name: Not on file  . Number of children: Not on file  . Years of education: Not on file  . Highest education level: Not on file  Occupational History  . Not on file  Tobacco Use  . Smoking status: Never Smoker  . Smokeless tobacco: Never Used  Substance and Sexual Activity  . Alcohol use: Not Currently  . Drug use: Never  . Sexual activity: Not Currently  Other Topics Concern  . Not on file  Social History Narrative  . Not on file   Social Determinants of Health   Financial Resource Strain:   . Difficulty of Paying Living Expenses:   Food Insecurity:   . Worried About Charity fundraiser in the Last Year:   . Arboriculturist in the Last Year:   Transportation Needs:   .  Lack of Transportation (Medical):   Marland Kitchen Lack of Transportation (Non-Medical):   Physical Activity:   . Days of Exercise per Week:   . Minutes of Exercise per Session:   Stress:   . Feeling of Stress :   Social Connections:   . Frequency of Communication with Friends and Family:   . Frequency of Social Gatherings with Friends and Family:   . Attends Religious Services:   . Active Member of Clubs or Organizations:   . Attends Archivist Meetings:   Marland Kitchen Marital Status:     Family History: Mother: Stroke at the age of 52.  Father: MI, CAD  Brother: Pancreatitic cancer Brother: CAD with stenting   Two other brothers without health  conditions noted.   ROS:   Please see the history of present illness.     All other systems reviewed and are negative.  EKGs/Labs/Other Studies Reviewed:    EKG:  EKG ordered today demonstrates NSR , T wave inversion V1-V5, QTc 480 ms  Recent Labs: No results found for requested labs within last 8760 hours.  10/09/2019 K 5.0,Hgb 13.2, glucose 94, creatinine 0.9 Recent Lipid Panel No results found for: CHOL, TRIG, HDL, CHOLHDL, VLDL, LDLCALC, LDLDIRECT 10/09/2019 CHOL 197, TG 132, LDL 111, HDL 63  Physical Exam:    VS:  BP 122/68   Pulse 77   Ht 5\' 6"  (1.676 m)   Wt 184 lb 12.8 oz (83.8 kg)   SpO2 99%   BMI 29.83 kg/m     Wt Readings from Last 3 Encounters:  11/28/19 184 lb 12.8 oz (83.8 kg)    GEN: Well nourished, well developed in no acute distress HEENT: Normal NECK: No JVD; No carotid bruits LYMPHATICS: No lymphadenopathy CARDIAC: RRR, no murmurs, rubs, gallops. Radial, DP, PT pulses 2+ bilaterally.  RESPIRATORY: Mild wheezing in the basilar lobes. Crackles in the right upper lobe. No respiratory distress.  ABDOMEN: Soft, non-tender, non-distended MUSCULOSKELETAL:  No edema; No deformity  SKIN: Warm and dry NEUROLOGIC:  Alert and oriented x 3 PSYCHIATRIC:  Normal affect   ASSESSMENT:    1. Chest pain of uncertain etiology    PLAN:    In order of problems listed above:  1. Mild coronary artery calcifications seen on CT imaging: Patient had Myoview stress test in 2013 that did not show ischemia. No current symptoms consistent with angina. Risk factors include HLD and family history, but no obesity, tobacco use, diabetes. EKG is stable without concerning signs.  At this time, will pursue stress test for complete evaluation.  2. HTN: Well controlled at this time with Losartan 100 mg daily. No medication changes made today.    3. Pericardial Cyst: Previously removal in 9470 complicated by right phrenic nerve damage. CT imaging shows compressive atelectasis due to  hemidiaphragmatic paralysis. Will obtain echo for re-evaluation of cyst.  4. Asbestosis: Due to work exposure. Follows with Dr. Pearlie Oyster.  Medication Adjustments/Labs and Tests Ordered: Current medicines are reviewed at length with the patient today.  Concerns regarding medicines are outlined above.  Orders Placed This Encounter  Procedures  . EXERCISE TOLERANCE TEST (ETT)  . EKG 12-Lead  . ECHOCARDIOGRAM COMPLETE   No orders of the defined types were placed in this encounter.   Patient Instructions  Medication Instructions:  No changes *If you need a refill on your cardiac medications before your next appointment, please call your pharmacy*   Lab Work: None ordered If you have labs (blood work) drawn today and your  tests are completely normal, you will receive your results only by: Marland Kitchen MyChart Message (if you have MyChart) OR . A paper copy in the mail If you have any lab test that is abnormal or we need to change your treatment, we will call you to review the results.   Testing/Procedures: Your physician has requested that you have an echocardiogram. Echocardiography is a painless test that uses sound waves to create images of your heart. It provides your doctor with information about the size and shape of your heart and how well your heart's chambers and valves are working. You may receive an ultrasound enhancing agent through an IV if needed to better visualize your heart during the echo.This procedure takes approximately one hour. There are no restrictions for this procedure. This will take place at the [address].   Your physician has requested that you have an exercise tolerance test. For further information please visit [email or web address]. Please also follow instruction sheet, as given. This will take place at [place], [address].  Do not drink or eat foods with caffeine for 24 hours before the test. (Chocolate, coffee, tea, or energy drinks)  If  you use an inhaler, bring it with you to the test.  Do not smoke for 4 hours before the test.  Wear comfortable shoes and clothing.   Follow-Up: At [facility], you and your health needs are our priority.  As part of our continuing mission to provide you with exceptional heart care, we have created designated Provider Care Teams.  These Care Teams include your primary Cardiologist (physician) and Advanced Practice Providers (APPs -  Physician Assistants and Nurse Practitioners) who all work together to provide you with the care you need, when you need it.  We recommend signing up for the patient portal called "MyChart".  Sign up information is provided on this After Visit Summary.  MyChart is used to connect with patients for Virtual Visits (Telemedicine).  Patients are able to view lab/test results, encounter notes, upcoming appointments, etc.  Non-urgent messages can be sent to your provider as well.   To learn more about what you can do with MyChart, go to [email or web address].    Your next appointment:   12 month(s)  The format for your next appointment:   In Person  Provider:   You may see Sanda Klein, MD or one of the following Advanced Practice Providers on your designated Care Team:    Almyra Deforest, PA-C  Fabian Sharp, [place] or   Roby Lofts, [place]      Signed, Dr. Jose Persia Internal Medicine PGY-2   11/28/2019, 9:48 AM   I have seen and examined the patient along with Dr. Jose Persia.  I have reviewed the chart, notes and new data.  I agree with her note.  Key new complaints: chest pain syndrome is not consistent with angina, but she does have exertional dyspnea. Key examination changes: normal CV exam Key new findings / data: coronary calcifications noted on CT  PLAN: ECG stress test and echocardiogram to look for coronary insufficiency and assess functional status, any sequelae of pericardial surgery.  Sanda Klein, MD, [place] [phone] 11/29/2019, 12:53 PM

## 2019-11-29 ENCOUNTER — Encounter: Payer: Self-pay | Admitting: Cardiovascular Disease

## 2019-11-29 NOTE — Addendum Note (Signed)
Addended by: Sanda Klein on: 11/29/2019 01:27 PM   Modules accepted: Level of Service

## 2019-11-30 ENCOUNTER — Ambulatory Visit
Admission: EM | Admit: 2019-11-30 | Discharge: 2019-11-30 | Disposition: A | Discharge status: Home / Self Care | Payer: Medicare Other | Attending: Emergency Medicine | Admitting: Emergency Medicine

## 2019-11-30 ENCOUNTER — Encounter: Payer: Self-pay | Admitting: Emergency Medicine

## 2019-11-30 ENCOUNTER — Other Ambulatory Visit: Payer: Self-pay

## 2019-11-30 DIAGNOSIS — L237 Allergic contact dermatitis due to plants, except food: Secondary | ICD-10-CM | POA: Diagnosis not present

## 2019-11-30 MED ORDER — PREDNISONE 10 MG (21) PO TBPK: ORAL_TABLET | ORAL | 0 refills | Status: DC

## 2019-11-30 MED ORDER — METHYLPREDNISOLONE SODIUM SUCC 125 MG IJ SOLR
60.0000 mg | Freq: Once | INTRAMUSCULAR | Status: AC
  Administered 2019-11-30: 60 mg via INTRAMUSCULAR

## 2019-11-30 NOTE — Discharge Instructions (Addendum)
Continue to take Benadryl as prescribed Prednisone taper was prescribed use as directed Take as prescribed and to completion Limit hot shower and baths, or bathe with warm water.   Moisturize skin daily Follow up with PCP if symptoms persists Return or go to the ER if you have any new or worsening symptoms

## 2019-11-30 NOTE — ED Provider Notes (Signed)
[place]   [id number] 11/30/19 Arrival Time: 41   Chief Complaint  Patient presents with  . Rash     SUBJECTIVE: History from: patient.  Alexandria Peters is a 69 y.o. female presents to the urgent care for complaint of rash for the past 5 days.  She denies changes in soaps, detergents, or anyone with similar symptoms.  She localizes the rash to her upper arm, abdomen and lower extremities.  She describes it as painful/ itchy/ red.  She has tried OTC benadryl without relief.  Her symptoms are made worse with heat.  She reports similar symptoms in the past that improved with steroid treatment.  Denies chills, fever, nausea, vomiting, diarrhea.  ROS: As per HPI.  All other pertinent ROS negative.     Past Medical History:  Diagnosis Date  . Elevated LDL cholesterol level 2013   mild  . Seasonal allergies   . SOB (shortness of breath)   . Systemic hypertension    Past Surgical History:  Procedure Laterality Date  . pericardial cyst removal  2009   laparoscopic   Allergies  Allergen Reactions  . Sulfa Antibiotics    No current facility-administered medications on file prior to encounter.   Current Outpatient Medications on File Prior to Encounter  Medication Sig Dispense Refill  . Cetirizine HCl (ZYRTEC ALLERGY) 10 MG CAPS Zyrtec 10 mg capsule  Take by oral route.    . ergocalciferol (VITAMIN D2) 1.25 MG (50000 UT) capsule ergocalciferol (vitamin D2) 1,250 mcg (50,000 unit) capsule  TAKE 1 CAPSULE ONCE WEEK FOR 8 WEEKS    . losartan (COZAAR) 100 MG tablet Take 100 mg by mouth daily.     Social History   Socioeconomic History  . Marital status: Divorced    Spouse name: Not on file  . Number of children: Not on file  . Years of education: Not on file  . Highest education level: Not on file  Occupational History  . Not on file  Tobacco Use  . Smoking status: Never Smoker  . Smokeless tobacco: Never Used  Substance and Sexual Activity  . Alcohol use:  Not Currently  . Drug use: Never  . Sexual activity: Not Currently  Other Topics Concern  . Not on file  Social History Narrative  . Not on file   Social Determinants of Health   Financial Resource Strain:   . Difficulty of Paying Living Expenses:   Food Insecurity:   . Worried About Charity fundraiser in the Last Year:   . Arboriculturist in the Last Year:   Transportation Needs:   . Film/video editor (Medical):   Marland Kitchen Lack of Transportation (Non-Medical):   Physical Activity:   . Days of Exercise per Week:   . Minutes of Exercise per Session:   Stress:   . Feeling of Stress :   Social Connections:   . Frequency of Communication with Friends and Family:   . Frequency of Social Gatherings with Friends and Family:   . Attends Religious Services:   . Active Member of Clubs or Organizations:   . Attends Archivist Meetings:   Marland Kitchen Marital Status:   Intimate Partner Violence:   . Fear of Current or Ex-Partner:   . Emotionally Abused:   Marland Kitchen Physically Abused:   . Sexually Abused:    Family History  Problem Relation Age of Onset  . CAD Other        Leith, premature cad  .  Heart attack Father        early 29's  . Angina Brother 52       with blockages    OBJECTIVE:  Vitals:   11/30/19 1140 11/30/19 1142  BP:  (!) 148/77  Pulse:  72  Resp:  17  Temp:  98.5 F (36.9 C)  TempSrc:  Oral  SpO2:  94%  Weight: 185 lb (83.9 kg)   Height: 5\' 6"  (1.676 m)      Physical Exam Vitals and nursing note reviewed.  Constitutional:      General: She is not in acute distress.    Appearance: Normal appearance. She is normal weight. She is not ill-appearing, toxic-appearing or diaphoretic.  HENT:     Head: Normocephalic.  Cardiovascular:     Rate and Rhythm: Normal rate and regular rhythm.     Pulses: Normal pulses.     Heart sounds: Normal heart sounds. No murmur heard.  No friction rub. No gallop.   Pulmonary:     Effort: Pulmonary effort is normal. No  respiratory distress.     Breath sounds: Normal breath sounds. No stridor. No wheezing, rhonchi or rales.  Chest:     Chest wall: No tenderness.  Skin:    General: Skin is warm.     Findings: Rash present. Rash is macular and vesicular.     Comments: Macular, vesicular rash present upper arm abdomen and lower extremities  Neurological:     Mental Status: She is alert and oriented to person, place, and time.      LABS:  No results found for this or any previous visit (from the past 24 hour(s)).   ASSESSMENT & PLAN:  1. Poison oak dermatitis     Meds ordered this encounter  Medications  . methylPREDNISolone sodium succinate (SOLU-MEDROL) 125 mg/2 mL injection 60 mg  . predniSONE (STERAPRED UNI-PAK 21 TAB) 10 MG (21) TBPK tablet    Sig: Take 6 tabs by mouth daily  for 1 days, then 5 tabs for 1 days, then 4 tabs for 1 days, then 3 tabs for 1days, 2 tabs for 1 days, then 1 tab by mouth daily for 1 days    Dispense:  21 tablet    Refill:  0    Discharge instructions.   Continue to take Benadryl as prescribed Prednisone taper was prescribed use as directed Take as prescribed and to completion Limit hot shower and baths, or bathe with warm water.   Moisturize skin daily Follow up with PCP if symptoms persists Return or go to the ER if you have any new or worsening symptoms  Reviewed expectations re: course of current medical issues. Questions answered. Outlined signs and symptoms indicating need for more acute intervention. Patient verbalized understanding. After Visit Summary given.      Note: This document was prepared using Dragon voice recognition software and may include unintentional dictation errors.      Emerson Monte, FNP 11/30/19 1150

## 2019-11-30 NOTE — ED Triage Notes (Signed)
Poison oak rash since Monday. On arms, legs and around abd. Will not get better

## 2019-12-13 ENCOUNTER — Telehealth (HOSPITAL_COMMUNITY): Payer: Self-pay

## 2019-12-13 NOTE — Telephone Encounter (Signed)
Encounter complete. 

## 2019-12-16 ENCOUNTER — Ambulatory Visit (HOSPITAL_COMMUNITY): Payer: Medicare Other | Attending: Cardiology

## 2019-12-16 ENCOUNTER — Other Ambulatory Visit: Payer: Self-pay

## 2019-12-16 DIAGNOSIS — R079 Chest pain, unspecified: Secondary | ICD-10-CM | POA: Insufficient documentation

## 2019-12-16 LAB — ECHOCARDIOGRAM COMPLETE
Area-P 1/2: 3.28 cm2
S' Lateral: 2.4 cm

## 2019-12-17 ENCOUNTER — Telehealth (HOSPITAL_COMMUNITY): Payer: Self-pay | Admitting: *Deleted

## 2019-12-17 NOTE — Telephone Encounter (Signed)
Close encounter 

## 2019-12-18 ENCOUNTER — Other Ambulatory Visit: Payer: Self-pay

## 2019-12-18 ENCOUNTER — Ambulatory Visit (HOSPITAL_COMMUNITY)
Admission: RE | Admit: 2019-12-18 | Discharge: 2019-12-18 | Disposition: A | Discharge status: Home / Self Care | Payer: Medicare Other | Source: Ambulatory Visit | Attending: Cardiovascular Disease | Admitting: Cardiovascular Disease

## 2019-12-18 DIAGNOSIS — R079 Chest pain, unspecified: Secondary | ICD-10-CM | POA: Diagnosis not present

## 2019-12-18 LAB — EXERCISE TOLERANCE TEST
Estimated workload: 9.6 METS
Exercise duration (min): 7 min
Exercise duration (sec): 43 s
MPHR: 154 {beats}/min
Peak HR: 164 {beats}/min
Percent HR: 106 %
Rest HR: 75 {beats}/min

## 2019-12-24 ENCOUNTER — Ambulatory Visit: Payer: Self-pay

## 2020-01-28 ENCOUNTER — Ambulatory Visit: Payer: Self-pay

## 2020-02-05 DIAGNOSIS — J22 Unspecified acute lower respiratory infection: Secondary | ICD-10-CM | POA: Diagnosis not present

## 2020-02-05 DIAGNOSIS — J61 Pneumoconiosis due to asbestos and other mineral fibers: Secondary | ICD-10-CM | POA: Diagnosis not present

## 2020-03-18 ENCOUNTER — Other Ambulatory Visit: Payer: Self-pay

## 2020-03-18 ENCOUNTER — Ambulatory Visit (INDEPENDENT_AMBULATORY_CARE_PROVIDER_SITE_OTHER): Payer: Self-pay | Admitting: *Deleted

## 2020-03-18 DIAGNOSIS — Z1211 Encounter for screening for malignant neoplasm of colon: Secondary | ICD-10-CM

## 2020-03-18 MED ORDER — PEG 3350-KCL-NA BICARB-NACL 420 G PO SOLR: 4000.0000 mL | Freq: Once | ORAL | 0 refills | Status: AC

## 2020-03-18 NOTE — Patient Instructions (Addendum)
Alexandria Peters   [birth year] MRN: [id number]    Procedure Date: 05/29/2020 Time to register: 7:30 am Place to register: [facility] Procedure Time: 9:00 am Scheduled provider: Dr. Abbey Chatters  PREPARATION FOR COLONOSCOPY WITH TRI-LYTE SPLIT PREP  Please notify us immediately if you are diabetic, take iron supplements, or if you are on Coumadin or any other blood thinners.   Please hold the following medications: See letter  You will need to purchase 1 fleet enema and 1 box of Bisacodyl $RemoveBefo'5mg'VCNuNUvwwWz$  tablets.   2 DAYS BEFORE PROCEDURE:  DATE: 05/27/2020   DAY: Wednesday Begin clear liquid diet AFTER your lunch meal. NO SOLID FOODS after this point.  1 DAY BEFORE PROCEDURE:  DATE: 05/28/2020   DAY: Thursday Continue clear liquids the entire day - NO SOLID FOOD.   Diabetic medications adjustments for today: n/a  At 2:00 pm:  Take 2 Bisacodyl tablets.   At 4:00pm:  Start drinking your solution. Make sure you mix well per instructions on the bottle. Try to drink 1 (one) 8 ounce glass every 10-15 minutes until you have consumed HALF the jug. You should complete by 6:00pm.You must keep the left over solution refrigerated until completed next day.  Continue clear liquids. You must drink plenty of clear liquids to prevent dehyration and kidney failure.     DAY OF PROCEDURE:   DATE: 05/29/2020  DAY: Friday If you take medications for your heart, blood pressure or breathing, you may take these medications.  Diabetic medications adjustments for today: n/a  Five hours before your procedure time @ 4:00 am:  Finish remaining amout of bowel prep, drinking 1 (one) 8 ounce glass every 10-15 minutes until complete. You have two hours to consume remaining prep.   Three hours before your procedure time @ 6:00 am:  Nothing by mouth.   At least one hour before going to the hospital:  Give yourself one Fleet enema. You may take your morning medications with sip of water unless we have instructed  otherwise.      Please see below for Dietary Information.  CLEAR LIQUIDS INCLUDE:  Water Jello (NOT red in color)   Ice Popsicles (NOT red in color)   Tea (sugar ok, no milk/cream) Powdered fruit flavored drinks  Coffee (sugar ok, no milk/cream) Gatorade/ Lemonade/ Kool-Aid  (NOT red in color)   Juice: apple, white grape, white cranberry Soft drinks  Clear bullion, consomme, broth (fat free beef/chicken/vegetable)  Carbonated beverages (any kind)  Strained chicken noodle soup Hard Candy   Remember: Clear liquids are liquids that will allow you to see your fingers on the other side of a clear glass. Be sure liquids are NOT red in color, and not cloudy, but CLEAR.  DO NOT EAT OR DRINK ANY OF THE FOLLOWING:  Dairy products of any kind   Cranberry juice Tomato juice / V8 juice   Grapefruit juice Orange juice     Red grape juice  Do not eat any solid foods, including such foods as: cereal, oatmeal, yogurt, fruits, vegetables, creamed soups, eggs, bread, crackers, pureed foods in a blender, etc.   HELPFUL HINTS FOR DRINKING PREP SOLUTION:   Make sure prep is extremely cold. Mix and refrigerate the the morning of the prep. You may also put in the freezer.   You may try mixing some Crystal Light or Country Time Lemonade if you prefer. Mix in small amounts; add more if necessary.  Try drinking through a straw  Rinse mouth with water  or a mouthwash between glasses, to remove after-taste.  Try sipping on a cold beverage /ice/ popsicles between glasses of prep.  Place a piece of sugar-free hard candy in mouth between glasses.  If you become nauseated, try consuming smaller amounts, or stretch out the time between glasses. Stop for 30-60 minutes, then slowly start back drinking.        OTHER INSTRUCTIONS  You will need a responsible adult at least 67 years of age to accompany you and drive you home. This person must remain in the waiting room during your procedure. The hospital  will cancel your procedure if you do not have a responsible adult with you.   1. Wear loose fitting clothing that is easily removed. 2. Leave jewelry and other valuables at home.  3. Remove all body piercing jewelry and leave at home. 4. Total time from sign-in until discharge is approximately 2-3 hours. 5. You should go home directly after your procedure and rest. You can resume normal activities the day after your procedure. 6. The day of your procedure you should not:  Drive  Make legal decisions  Operate machinery  Drink alcohol  Return to work   You may call the office (Dept: [phone]) before 5:00pm, or page the doctor on call [phone]) after 5:00pm, for further instructions, if necessary.   Insurance Information YOU WILL NEED TO CHECK WITH YOUR INSURANCE COMPANY FOR THE BENEFITS OF COVERAGE YOU HAVE FOR THIS PROCEDURE.  UNFORTUNATELY, NOT ALL INSURANCE COMPANIES HAVE BENEFITS TO COVER ALL OR PART OF THESE TYPES OF PROCEDURES.  IT IS YOUR RESPONSIBILITY TO CHECK YOUR BENEFITS, HOWEVER, WE WILL BE GLAD TO ASSIST YOU WITH ANY CODES YOUR INSURANCE COMPANY MAY NEED.    PLEASE NOTE THAT MOST INSURANCE COMPANIES WILL NOT COVER A SCREENING COLONOSCOPY FOR PEOPLE UNDER THE AGE OF 50  IF YOU HAVE BCBS INSURANCE, YOU MAY HAVE BENEFITS FOR A SCREENING COLONOSCOPY BUT IF POLYPS ARE FOUND THE DIAGNOSIS WILL CHANGE AND THEN YOU MAY HAVE A DEDUCTIBLE THAT WILL NEED TO BE MET. SO PLEASE MAKE SURE YOU CHECK YOUR BENEFITS FOR A SCREENING COLONOSCOPY AS WELL AS A DIAGNOSTIC COLONOSCOPY.

## 2020-03-18 NOTE — Progress Notes (Signed)
Gastroenterology Pre-Procedure Review  Request Date: 03/18/2020 Requesting Physician: Rowan Blase, PA @ [facility], Last TCS done 09/2009 by Dr. Arnoldo Morale, no report in [place], pt unsure of findings, family hx of colon cancer (aunt)  PATIENT REVIEW QUESTIONS: The patient responded to the following health history questions as indicated:    1. Diabetes Melitis: no 2. Joint replacements in the past 12 months: no 3. Major health problems in the past 3 months: no 4. Has an artificial valve or MVP: no 5. Has a defibrillator: no 6. Has been advised in past to take antibiotics in advance of a procedure like teeth cleaning: no 7. Family history of colon cancer: yes, aunt: age 87  8. Alcohol Use: yes, 1 beer every 1 to 2 months 9. Illicit drug Use: no 10. History of sleep apnea: no  11. History of coronary artery or other vascular stents placed within the last 12 months: no 12. History of any prior anesthesia complications: no 13. There is no height or weight on file to calculate BMI.ht: 5'6 wt: 171 lbs    MEDICATIONS & ALLERGIES:    Patient reports the following regarding taking any blood thinners:   Plavix? no Aspirin? no Coumadin? no Brilinta? no Xarelto? no Eliquis? no Pradaxa? no Savaysa? no Effient? no  Patient confirms/reports the following medications:  Current Outpatient Medications  Medication Sig Dispense Refill   Cetirizine HCl (ZYRTEC ALLERGY) 10 MG CAPS daily.      losartan (COZAAR) 100 MG tablet Take 100 mg by mouth daily.     phentermine (ADIPEX-P) 37.5 MG tablet daily.     No current facility-administered medications for this visit.    Patient confirms/reports the following allergies:  Allergies  Allergen Reactions   Sulfa Antibiotics     No orders of the defined types were placed in this encounter.   AUTHORIZATION INFORMATION Primary Insurance: [employer] Medicare,  ID #: [id number],  Group #: [id number] Pre-Cert / Josem Kaufmann required: No, not required  SCHEDULE  INFORMATION: Procedure has been scheduled as follows:  Date: 04/20/2020, Time: 8:30 Location: [facility] with Dr. Abbey Chatters  This Gastroenterology Pre-Precedure Review Form is being routed to the following provider(s): Neil Crouch, PA

## 2020-03-23 ENCOUNTER — Encounter: Payer: Self-pay | Admitting: *Deleted

## 2020-03-23 ENCOUNTER — Telehealth: Payer: Self-pay | Admitting: *Deleted

## 2020-03-23 NOTE — Progress Notes (Signed)
ASA II.

## 2020-03-23 NOTE — Telephone Encounter (Signed)
Pt called back and informed her to hold phentermine 10 days prior to procedure.  She is aware that I am mailing out instructions and letter as well.  Pt voiced understanding.

## 2020-03-23 NOTE — Telephone Encounter (Signed)
Lmom for pt to call me back. 

## 2020-03-23 NOTE — Progress Notes (Signed)
Ok to schedule.  She will need to hold phentermine for 10 days before the procedure.

## 2020-03-23 NOTE — Progress Notes (Signed)
Mailed letter to Alexandria Peters about Phentermine adjustments.

## 2020-04-14 ENCOUNTER — Encounter (HOSPITAL_COMMUNITY): Payer: Self-pay

## 2020-04-15 ENCOUNTER — Telehealth: Payer: Self-pay | Admitting: *Deleted

## 2020-04-15 NOTE — Telephone Encounter (Addendum)
Called pt and informed her that her procedure on 04/20/20 had to be rescheduled due to a death in [address] [place] family.  Pt rescheduled her Covid screening to 05/28/2019 at 9:00.  She rescheduled her procedure to 05/30/2019 at 9:00 with arrival at 7:30.  Pt aware that I am mailing out new instructions.  Lmom of Carolyn in Endo.

## 2020-04-17 ENCOUNTER — Other Ambulatory Visit (HOSPITAL_COMMUNITY)
Admission: RE | Admit: 2020-04-17 | Discharge: 2020-04-17 | Disposition: A | Discharge status: Home / Self Care | Payer: Medicare Other | Source: Ambulatory Visit | Attending: Internal Medicine | Admitting: Internal Medicine

## 2020-05-26 ENCOUNTER — Other Ambulatory Visit (HOSPITAL_COMMUNITY): Payer: Self-pay | Admitting: *Deleted

## 2020-05-27 ENCOUNTER — Other Ambulatory Visit: Payer: Self-pay

## 2020-05-27 ENCOUNTER — Other Ambulatory Visit (HOSPITAL_COMMUNITY)
Admission: RE | Admit: 2020-05-27 | Discharge: 2020-05-27 | Disposition: A | Discharge status: Home / Self Care | Payer: Medicare Other | Source: Ambulatory Visit | Attending: Internal Medicine | Admitting: Internal Medicine

## 2020-05-27 ENCOUNTER — Telehealth: Payer: Self-pay

## 2020-05-27 DIAGNOSIS — Z01812 Encounter for preprocedural laboratory examination: Secondary | ICD-10-CM | POA: Diagnosis not present

## 2020-05-27 DIAGNOSIS — Z20822 Contact with and (suspected) exposure to covid-19: Secondary | ICD-10-CM | POA: Insufficient documentation

## 2020-05-27 NOTE — Telephone Encounter (Signed)
Dr. Abbey Chatters   This pt wants you to release her covid test in the system so she can print it off. She had someone by the name of Tammy from [facility] to call she said.

## 2020-05-28 ENCOUNTER — Encounter (HOSPITAL_COMMUNITY): Payer: Self-pay | Admitting: *Deleted

## 2020-05-28 LAB — SARS CORONAVIRUS 2 (TAT 6-24 HRS): SARS Coronavirus 2: NEGATIVE

## 2020-05-29 ENCOUNTER — Encounter (HOSPITAL_COMMUNITY)
Admission: RE | Disposition: A | Discharge status: Home / Self Care | Payer: Self-pay | Source: Home / Self Care | Attending: Internal Medicine

## 2020-05-29 ENCOUNTER — Ambulatory Visit (HOSPITAL_COMMUNITY)
Admission: RE | Admit: 2020-05-29 | Discharge: 2020-05-29 | Disposition: A | Discharge status: Home / Self Care | Payer: Medicare Other | Attending: Internal Medicine | Admitting: Internal Medicine

## 2020-05-29 ENCOUNTER — Ambulatory Visit (HOSPITAL_COMMUNITY): Payer: Medicare Other | Admitting: Anesthesiology

## 2020-05-29 ENCOUNTER — Encounter (HOSPITAL_COMMUNITY): Payer: Self-pay

## 2020-05-29 ENCOUNTER — Other Ambulatory Visit: Payer: Self-pay

## 2020-05-29 DIAGNOSIS — D122 Benign neoplasm of ascending colon: Secondary | ICD-10-CM | POA: Diagnosis not present

## 2020-05-29 DIAGNOSIS — Z1211 Encounter for screening for malignant neoplasm of colon: Secondary | ICD-10-CM | POA: Diagnosis not present

## 2020-05-29 DIAGNOSIS — D123 Benign neoplasm of transverse colon: Secondary | ICD-10-CM

## 2020-05-29 DIAGNOSIS — Z800 Family history of malignant neoplasm of digestive organs: Secondary | ICD-10-CM | POA: Diagnosis not present

## 2020-05-29 DIAGNOSIS — K635 Polyp of colon: Secondary | ICD-10-CM | POA: Diagnosis not present

## 2020-05-29 DIAGNOSIS — Z79899 Other long term (current) drug therapy: Secondary | ICD-10-CM | POA: Diagnosis not present

## 2020-05-29 DIAGNOSIS — K5730 Diverticulosis of large intestine without perforation or abscess without bleeding: Secondary | ICD-10-CM | POA: Insufficient documentation

## 2020-05-29 DIAGNOSIS — K648 Other hemorrhoids: Secondary | ICD-10-CM | POA: Diagnosis not present

## 2020-05-29 HISTORY — PX: POLYPECTOMY: SHX149

## 2020-05-29 HISTORY — PX: COLONOSCOPY WITH PROPOFOL: SHX5780

## 2020-05-29 SURGERY — COLONOSCOPY WITH PROPOFOL
Anesthesia: General

## 2020-05-29 MED ORDER — CHLORHEXIDINE GLUCONATE CLOTH 2 % EX PADS: 6.0000 | MEDICATED_PAD | Freq: Once | CUTANEOUS | Status: DC

## 2020-05-29 MED ORDER — PROPOFOL 10 MG/ML IV BOLUS
INTRAVENOUS | Status: DC | PRN
  Administered 2020-05-29: 150 ug/kg/min via INTRAVENOUS
  Administered 2020-05-29: 100 mg via INTRAVENOUS

## 2020-05-29 MED ORDER — LACTATED RINGERS IV SOLN: INTRAVENOUS | Status: DC | PRN

## 2020-05-29 MED ORDER — LACTATED RINGERS IV SOLN: Freq: Once | INTRAVENOUS | Status: AC

## 2020-05-29 NOTE — H&P (Signed)
Primary Care Physician:  Ginger Organ Primary Gastroenterologist:  Dr. Abbey Chatters  Pre-Procedure History & Physical: HPI:  Alexandria Peters is a 70 y.o. female is here for a colonoscopy for colon cancer screening purposes.  Last CLN 2011 unsure of findings. Notes aunt with colon cancer.  No melena or hematochezia.  No abdominal pain or unintentional weight loss.  No change in bowel habits.  Overall feels well from a GI standpoint.  Past Medical History:  Diagnosis Date  . Elevated LDL cholesterol level 2013   mild  . Seasonal allergies   . SOB (shortness of breath)   . Systemic hypertension     Past Surgical History:  Procedure Laterality Date  . pericardial cyst removal  2009   laparoscopic    Prior to Admission medications   Medication Sig Start Date End Date Taking? Authorizing Provider  albuterol (VENTOLIN HFA) 108 (90 Base) MCG/ACT inhaler Inhale 2 puffs into the lungs every 6 (six) hours as needed for wheezing. 03/26/20  Yes [provider]  azelastine (OPTIVAR) 0.05 % ophthalmic solution Place 1 drop into both eyes daily. 04/10/20  Yes [provider]  Cetirizine HCl (ZYRTEC ALLERGY) 10 MG CAPS Take 10 mg by mouth daily.    Yes [provider]  losartan (COZAAR) 100 MG tablet Take 100 mg by mouth daily.   Yes [provider]  phentermine (ADIPEX-P) 37.5 MG tablet Take 37.5 mg by mouth daily.    Yes [provider]  hydrocortisone 2.5 % ointment Apply 1 application topically daily. 03/24/20   [provider]    Allergies as of 03/23/2020 - Review Complete 03/18/2020  Allergen Reaction Noted  . Sulfa antibiotics      Family History  Problem Relation Age of Onset  . CAD Other        [place], premature cad  . Heart attack Father        early 75's  . Angina Brother 54       with blockages  . Colon cancer Maternal Aunt     Social History   Socioeconomic History  . Marital status: Divorced    Spouse name: Not on  file  . Number of children: Not on file  . Years of education: Not on file  . Highest education level: Not on file  Occupational History  . Not on file  Tobacco Use  . Smoking status: Never Smoker  . Smokeless tobacco: Never Used  Vaping Use  . Vaping Use: Never used  Substance and Sexual Activity  . Alcohol use: Not Currently  . Drug use: Never  . Sexual activity: Not Currently  Other Topics Concern  . Not on file  Social History Narrative  . Not on file   Social Determinants of Health   Financial Resource Strain: Not on file  Food Insecurity: Not on file  Transportation Needs: Not on file  Physical Activity: Not on file  Stress: Not on file  Social Connections: Not on file  Intimate Partner Violence: Not on file    Review of Systems: See HPI, otherwise negative ROS  Impression/Plan: Alexandria Peters is here for a colonoscopy to be performed for colon cancer screening purposes.  The risks of the procedure including infection, bleed, or perforation as well as benefits, limitations, alternatives and imponderables have been reviewed with the patient. Questions have been answered. All parties agreeable.

## 2020-05-29 NOTE — Discharge Instructions (Addendum)
Colonoscopy Discharge Instructions  Read the instructions outlined below and refer to this sheet in the next few weeks. These discharge instructions provide you with general information on caring for yourself after you leave the hospital. Your doctor may also give you specific instructions. While your treatment has been planned according to the most current medical practices available, unavoidable complications occasionally occur.   ACTIVITY  You may resume your regular activity, but move at a slower pace for the next 24 hours.   Take frequent rest periods for the next 24 hours.   Walking will help get rid of the air and reduce the bloated feeling in your belly (abdomen).   No driving for 24 hours (because of the medicine (anesthesia) used during the test).    Do not sign any important legal documents or operate any machinery for 24 hours (because of the anesthesia used during the test).  NUTRITION  Drink plenty of fluids.   You may resume your normal diet as instructed by your doctor.   Begin with a light meal and progress to your normal diet. Heavy or fried foods are harder to digest and may make you feel sick to your stomach (nauseated).   Avoid alcoholic beverages for 24 hours or as instructed.  MEDICATIONS  You may resume your normal medications unless your doctor tells you otherwise.  WHAT YOU CAN EXPECT TODAY  Some feelings of bloating in the abdomen.   Passage of more gas than usual.   Spotting of blood in your stool or on the toilet paper.  IF YOU HAD POLYPS REMOVED DURING THE COLONOSCOPY:  No aspirin products for 7 days or as instructed.   No alcohol for 7 days or as instructed.   Eat a soft diet for the next 24 hours.  FINDING OUT THE RESULTS OF YOUR TEST Not all test results are available during your visit. If your test results are not back during the visit, make an appointment with your caregiver to find out the results. Do not assume everything is normal if  you have not heard from your caregiver or the medical facility. It is important for you to follow up on all of your test results.  SEEK IMMEDIATE MEDICAL ATTENTION IF:  You have more than a spotting of blood in your stool.   Your belly is swollen (abdominal distention).   You are nauseated or vomiting.   You have a temperature over 101.   You have abdominal pain or discomfort that is severe or gets worse throughout the day.   Your colonoscopy revealed 1 polyp(s) which I removed successfully. Await pathology results, my office will contact you. I recommend repeating colonoscopy in 3 years for surveillance purposes.    I hope you have a great rest of your week!  Elon Alas. Abbey Chatters, D.O. Gastroenterology and Hepatology [facility]   Colon Polyps  Colon polyps are tissue growths inside the colon, which is part of the large intestine. They are one of the types of polyps that can grow in the body. A polyp may be a round bump or a mushroom-shaped growth. You could have one polyp or more than one. Most colon polyps are noncancerous (benign). However, some colon polyps can become cancerous over time. Finding and removing the polyps early can help prevent this. What are the causes? The exact cause of colon polyps is not known. What increases the risk? The following factors may make you more likely to develop this condition:  Having a family  history of colorectal cancer or colon polyps.  Being older than 68 years of age.  Being younger than 68 years of age and having a significant family history of colorectal cancer or colon polyps or a genetic condition that puts you at higher risk of getting colon polyps.  Having inflammatory bowel disease, such as ulcerative colitis or Crohn's disease.  Having certain conditions passed from parent to child (hereditary conditions), such as: ? Familial adenomatous polyposis (FAP). ? Lynch syndrome. ? Turcot  syndrome. ? Peutz-Jeghers syndrome. ? MUTYH-associated polyposis (MAP).  Being overweight.  Certain lifestyle factors. These include smoking cigarettes, drinking too much alcohol, not getting enough exercise, and eating a diet that is high in fat and red meat and low in fiber.  Having had childhood cancer that was treated with radiation of the abdomen. What are the signs or symptoms? Many times, there are no symptoms. If you have symptoms, they may include:  Blood coming from the rectum during a bowel movement.  Blood in the stool (feces). The blood may be bright red or very dark in color.  Pain in the abdomen.  A change in bowel habits, such as constipation or diarrhea. How is this diagnosed? This condition is diagnosed with a colonoscopy. This is a procedure in which a lighted, flexible scope is inserted into the opening between the buttocks (anus) and then passed into the colon to examine the area. Polyps are sometimes found when a colonoscopy is done as part of routine cancer screening tests. How is this treated? This condition is treated by removing any polyps that are found. Most polyps can be removed during a colonoscopy. Those polyps will then be tested for cancer. Additional treatment may be needed depending on the results of testing. Follow these instructions at home: Eating and drinking  Eat foods that are high in fiber, such as fruits, vegetables, and whole grains.  Eat foods that are high in calcium and vitamin D, such as milk, cheese, yogurt, eggs, liver, fish, and broccoli.  Limit foods that are high in fat, such as fried foods and desserts.  Limit the amount of red meat, precooked or cured meat, or other processed meat that you eat, such as hot dogs, sausages, bacon, or meat loaves.  Limit sugary drinks.   Lifestyle  Maintain a healthy weight, or lose weight if recommended by your health care provider.  Exercise every day or as told by your health care  provider.  Do not use any products that contain nicotine or tobacco, such as cigarettes, e-cigarettes, and chewing tobacco. If you need help quitting, ask your health care provider.  Do not drink alcohol if: ? Your health care provider tells you not to drink. ? You are pregnant, may be pregnant, or are planning to become pregnant.  If you drink alcohol: ? Limit how much you use to:  0-1 drink a day for women.  0-2 drinks a day for men. ? Know how much alcohol is in your drink. In the [place] one drink equals one 12 oz bottle of beer (355 mL), one 5 oz glass of wine (148 mL), or one 1 oz glass of hard liquor (44 mL). General instructions  Take over-the-counter and prescription medicines only as told by your health care provider.  Keep all follow-up visits. This is important. This includes having regularly scheduled colonoscopies. Talk to your health care provider about when you need a colonoscopy. Contact a health care provider if:  You have new or worsening bleeding  during a bowel movement.  You have new or increased blood in your stool.  You have a change in bowel habits.  You lose weight for no known reason. Summary  Colon polyps are tissue growths inside the colon, which is part of the large intestine. They are one type of polyp that can grow in the body.  Most colon polyps are noncancerous (benign), but some can become cancerous over time.  This condition is diagnosed with a colonoscopy.  This condition is treated by removing any polyps that are found. Most polyps can be removed during a colonoscopy. This information is not intended to replace advice given to you by your health care provider. Make sure you discuss any questions you have with your health care provider. Document Revised: 08/21/2019 Document Reviewed: 08/21/2019 Elsevier Patient Education  2021 [employer].

## 2020-05-29 NOTE — Anesthesia Preprocedure Evaluation (Addendum)
Anesthesia Evaluation  Patient identified by MRN, date of birth, ID band Patient awake    Reviewed: Allergy & Precautions, NPO status , Patient's Chart, lab work & pertinent test results  History of Anesthesia Complications Negative for: history of anesthetic complications  Airway Mallampati: II  TM Distance: >3 FB Neck ROM: Full    Dental  (+) Dental Advisory Given, Caps Crowns :   Pulmonary shortness of breath (Asbestosis ) and with exertion,  Asbestosis    Pulmonary exam normal breath sounds clear to auscultation       Cardiovascular Exercise Tolerance: Good hypertension, Pt. on medications Normal cardiovascular exam Rhythm:Regular Rate:Normal     Neuro/Psych negative neurological ROS  negative psych ROS   GI/Hepatic negative GI ROS, Neg liver ROS,   Endo/Other  negative endocrine ROS  Renal/GU negative Renal ROS  negative genitourinary   Musculoskeletal negative musculoskeletal ROS (+)   Abdominal   Peds  Hematology negative hematology ROS (+)   Anesthesia Other Findings   Reproductive/Obstetrics negative OB ROS                           Anesthesia Physical Anesthesia Plan  ASA: II  Anesthesia Plan: General   Post-op Pain Management:    Induction: Intravenous  PONV Risk Score and Plan: TIVA  Airway Management Planned: Nasal Cannula and Natural Airway  Additional Equipment:   Intra-op Plan:   Post-operative Plan:   Informed Consent: I have reviewed the patients History and Physical, chart, labs and discussed the procedure including the risks, benefits and alternatives for the proposed anesthesia with the patient or authorized representative who has indicated his/her understanding and acceptance.     Dental advisory given  Plan Discussed with: CRNA and Surgeon  Anesthesia Plan Comments:         Anesthesia Quick Evaluation

## 2020-05-29 NOTE — Op Note (Signed)
[facility] Patient Name: Alexandria Peters Procedure Date: 05/29/2020 8:15 AM MRN: [id number] Date of Birth: 01/22/1951 Attending MD: Elon Alas. Abbey Chatters DO CSN: [id number] Age: 70 Admit Type: Outpatient Procedure:                Colonoscopy Indications:              Screening for colorectal malignant neoplasm Providers:                Elon Alas. Mathieu Schloemer, DO, Otis Peak B. Sharon Seller, RN,                            Nelma Rothman, Technician Referring MD:              Medicines:                See the Anesthesia note for documentation of the                            administered medications Complications:            No immediate complications. Estimated Blood Loss:     Estimated blood loss was minimal. Procedure:                Pre-Anesthesia Assessment:                           - The anesthesia plan was to use monitored                            anesthesia care (MAC).                           After obtaining informed consent, the colonoscope                            was passed under direct vision. Throughout the                            procedure, the patient's blood pressure, pulse, and                            oxygen saturations were monitored continuously. The                            [id number]-[id number] ([id number]) scope was introduced through                            the anus and advanced to the the cecum, identified                            by appendiceal orifice and ileocecal valve. The                            colonoscopy was performed without difficulty. The                            patient tolerated the procedure  well. The quality                            of the bowel preparation was evaluated using the                            BBPS [facility] Bowel Preparation Scale) with scores                            of: Right Colon = 3, Transverse Colon = 3 and Left                            Colon = 3 (entire mucosa seen well with no residual                            staining,  small fragments of stool or opaque                            liquid). The total BBPS score equals 9. Scope In: 8:28:43 AM Scope Out: 8:51:46 AM Scope Withdrawal Time: 0 hours 18 minutes 57 seconds  Total Procedure Duration: 0 hours 23 minutes 3 seconds  Findings:      The perianal and digital rectal examinations were normal.      Non-bleeding internal hemorrhoids were found during endoscopy.      Scattered small-mouthed diverticula were found in the sigmoid colon,       transverse colon and ascending colon.      A 12 mm polyp was found in the ascending colon. The polyp was flat.       Preparations were made for mucosal resection. Orise gel was injected       with adequate lift of the lesion from the muscularis propria. Polyp       margins were well demarcated. Snare mucosal resection was performed. A       15 mm area was resected. Resection and retrieval were complete. There       was no bleeding at the end of the procedure. Impression:               - Non-bleeding internal hemorrhoids.                           - Diverticulosis in the sigmoid colon, in the                            transverse colon and in the ascending colon.                           - One 12 mm polyp in the ascending colon, removed                            with mucosal resection. Resected and retrieved.                           - Mucosal resection was performed. Resection and  retrieval were complete. Moderate Sedation:      Per Anesthesia Care Recommendation:           - Patient has a contact number available for                            emergencies. The signs and symptoms of potential                            delayed complications were discussed with the                            patient. Return to normal activities tomorrow.                            Written discharge instructions were provided to the                            patient.                           - Resume  previous diet.                           - Continue present medications.                           - Await pathology results.                           - Repeat colonoscopy in 3 years for surveillance.                           - Return to GI clinic PRN. Procedure Code(s):        --- Professional ---                           [phone], Colonoscopy, flexible; with endoscopic                            mucosal resection Diagnosis Code(s):        --- Professional ---                           Z12.11, Encounter for screening for malignant                            neoplasm of colon                           K64.8, Other hemorrhoids                           K63.5, Polyp of colon                           K57.30, Diverticulosis of large intestine without  perforation or abscess without bleeding CPT copyright 2019 American Medical Association. All rights reserved. The codes documented in this report are preliminary and upon coder review may  be revised to meet current compliance requirements. Elon Alas. Abbey Chatters, DO [place] Abbey Chatters, DO 05/29/2020 8:55:07 AM This report has been signed electronically. Number of Addenda: 0

## 2020-05-29 NOTE — Transfer of Care (Signed)
Immediate Anesthesia Transfer of Care Note  Patient: Alexandria Peters  Procedure(s) Performed: COLONOSCOPY WITH PROPOFOL (N/A ) POLYPECTOMY INTESTINAL  Patient Location: Endoscopy Unit  Anesthesia Type:General  Level of Consciousness: awake, alert , oriented and patient cooperative  Airway & Oxygen Therapy: Patient Spontanous Breathing  Post-op Assessment: Report given to RN, Post -op Vital signs reviewed and stable and Patient moving all extremities  Post vital signs: Reviewed and stable  Last Vitals:  Vitals Value Taken Time  BP    Temp    Pulse    Resp    SpO2      Last Pain:  Vitals:   05/29/20 0824  TempSrc:   PainSc: 0-No pain      Patients Stated Pain Goal: 6 (59/74/16 3845)  Complications: No complications documented.

## 2020-05-29 NOTE — Anesthesia Postprocedure Evaluation (Signed)
Anesthesia Post Note  Patient: Alexandria Peters  Procedure(s) Performed: COLONOSCOPY WITH PROPOFOL (N/A ) POLYPECTOMY INTESTINAL  Patient location during evaluation: Endoscopy Anesthesia Type: General Level of consciousness: awake, oriented, awake and alert and patient cooperative Pain management: pain level controlled Vital Signs Assessment: post-procedure vital signs reviewed and stable Respiratory status: spontaneous breathing, respiratory function stable and nonlabored ventilation Cardiovascular status: blood pressure returned to baseline and stable Postop Assessment: no headache and no backache Anesthetic complications: no   No complications documented.   Last Vitals:  Vitals:   05/29/20 0738 05/29/20 0854  BP: (!) 145/76   Pulse: 85   Resp: 17 17  Temp: 37 C 36.5 C  SpO2: 97% 100%    Last Pain:  Vitals:   05/29/20 0854  TempSrc: Oral  PainSc: 0-No pain                 Tacy Learn

## 2020-06-01 LAB — SURGICAL PATHOLOGY

## 2020-06-04 ENCOUNTER — Encounter (HOSPITAL_COMMUNITY): Payer: Self-pay | Admitting: Internal Medicine

## 2020-06-08 NOTE — Progress Notes (Signed)
Dear Alexandria Peters  The polyps removed were tubular adenoma's and you will need to repeat your colonoscopy in 3 years as previously discussed with Dr. Abbey Chatters.   Please follow-up with [place] as needed. We will contact you closer to the time of your colonoscopy.     Thank you, Floria Raveling, CMA

## 2020-07-08 DIAGNOSIS — H6990 Unspecified Eustachian tube disorder, unspecified ear: Secondary | ICD-10-CM | POA: Diagnosis not present

## 2020-07-08 DIAGNOSIS — J309 Allergic rhinitis, unspecified: Secondary | ICD-10-CM | POA: Diagnosis not present

## 2020-07-08 DIAGNOSIS — E041 Nontoxic single thyroid nodule: Secondary | ICD-10-CM | POA: Diagnosis not present

## 2020-07-08 DIAGNOSIS — Z23 Encounter for immunization: Secondary | ICD-10-CM | POA: Diagnosis not present

## 2020-07-09 DIAGNOSIS — E041 Nontoxic single thyroid nodule: Secondary | ICD-10-CM | POA: Diagnosis not present

## 2020-07-09 DIAGNOSIS — Z23 Encounter for immunization: Secondary | ICD-10-CM | POA: Diagnosis not present

## 2020-07-10 DIAGNOSIS — E042 Nontoxic multinodular goiter: Secondary | ICD-10-CM | POA: Diagnosis not present

## 2020-07-10 DIAGNOSIS — J309 Allergic rhinitis, unspecified: Secondary | ICD-10-CM | POA: Diagnosis not present

## 2020-07-16 DIAGNOSIS — Z85828 Personal history of other malignant neoplasm of skin: Secondary | ICD-10-CM | POA: Diagnosis not present

## 2020-07-16 DIAGNOSIS — L578 Other skin changes due to chronic exposure to nonionizing radiation: Secondary | ICD-10-CM | POA: Diagnosis not present

## 2020-07-16 DIAGNOSIS — L821 Other seborrheic keratosis: Secondary | ICD-10-CM | POA: Diagnosis not present

## 2020-07-16 DIAGNOSIS — L814 Other melanin hyperpigmentation: Secondary | ICD-10-CM | POA: Diagnosis not present

## 2020-07-16 DIAGNOSIS — D225 Melanocytic nevi of trunk: Secondary | ICD-10-CM | POA: Diagnosis not present

## 2020-07-16 DIAGNOSIS — E041 Nontoxic single thyroid nodule: Secondary | ICD-10-CM | POA: Diagnosis not present

## 2020-07-23 DIAGNOSIS — E042 Nontoxic multinodular goiter: Secondary | ICD-10-CM | POA: Diagnosis not present

## 2020-08-13 DIAGNOSIS — Z1231 Encounter for screening mammogram for malignant neoplasm of breast: Secondary | ICD-10-CM | POA: Diagnosis not present

## 2020-10-22 DIAGNOSIS — E782 Mixed hyperlipidemia: Secondary | ICD-10-CM | POA: Diagnosis not present

## 2020-10-22 DIAGNOSIS — Z1389 Encounter for screening for other disorder: Secondary | ICD-10-CM | POA: Diagnosis not present

## 2020-10-22 DIAGNOSIS — Z0000 Encounter for general adult medical examination without abnormal findings: Secondary | ICD-10-CM | POA: Diagnosis not present

## 2020-10-22 DIAGNOSIS — Z0001 Encounter for general adult medical examination with abnormal findings: Secondary | ICD-10-CM | POA: Diagnosis not present

## 2020-10-26 DIAGNOSIS — Z0000 Encounter for general adult medical examination without abnormal findings: Secondary | ICD-10-CM | POA: Diagnosis not present

## 2020-12-23 DIAGNOSIS — H40033 Anatomical narrow angle, bilateral: Secondary | ICD-10-CM | POA: Diagnosis not present

## 2020-12-23 DIAGNOSIS — H04123 Dry eye syndrome of bilateral lacrimal glands: Secondary | ICD-10-CM | POA: Diagnosis not present

## 2021-01-19 ENCOUNTER — Ambulatory Visit: Payer: Medicare Other | Admitting: Cardiovascular Disease

## 2021-01-19 ENCOUNTER — Other Ambulatory Visit: Payer: Self-pay

## 2021-01-19 ENCOUNTER — Encounter: Payer: Self-pay | Admitting: Cardiovascular Disease

## 2021-01-19 VITALS — BP 118/68 | HR 86 | Resp 20 | Ht 66.0 in | Wt 178.4 lb

## 2021-01-19 DIAGNOSIS — Z8774 Personal history of (corrected) congenital malformations of heart and circulatory system: Secondary | ICD-10-CM

## 2021-01-19 DIAGNOSIS — I10 Essential (primary) hypertension: Secondary | ICD-10-CM | POA: Diagnosis not present

## 2021-01-19 DIAGNOSIS — I2510 Atherosclerotic heart disease of native coronary artery without angina pectoris: Secondary | ICD-10-CM

## 2021-01-19 DIAGNOSIS — E663 Overweight: Secondary | ICD-10-CM | POA: Diagnosis not present

## 2021-01-19 DIAGNOSIS — Z8709 Personal history of other diseases of the respiratory system: Secondary | ICD-10-CM

## 2021-01-19 NOTE — Progress Notes (Signed)
Cardiology Consult Note:    Date:  01/19/2021   ID:  [name], DOB 1950/09/02, MRN [id number]  PCP:  Ginger Organ  [facility] Cardiologist:  Sanda Klein, MD  [facility] Electrophysiologist:  None   Referring MD: [name], PA-C   CC: Chest pain  Alexandria Peters is a 70 y.o. female who is being seen today for the evaluation of chest pain at the request of [name], PA-C.  History of Present Illness:    TYSHARA CAZAREZ is a 70 y.o. 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  She has a chronically abnormal ECG with low QRS voltage and inverted T waves across all the precordial leads.  The patient specifically denies any chest pain at rest exertion, dyspnea at rest or with exertion, orthopnea, paroxysmal nocturnal dyspnea, syncope, palpitations, focal neurological deficits, intermittent claudication, lower extremity edema, unexplained weight gain, cough, hemoptysis or wheezing.  She is going to the bariatric center and has started taking phentermine.  In the past this helped her lose weight.  She started taking in February but then had to interrupt it.  She started back on it a couple of weeks ago.  For couple weeks when the weather was really hot she had a lot of issues with dizziness when she stands up that sounds like orthostatic hypotension and this has limited her walking.  Prior to that she was walking 2 miles a day.  She denies exertional angina or dyspnea, edema, palpitations, syncope, other focal neurological complaints.  In August 2021 she had a normal stress test and a normal echocardiogram, without any evidence of pericardial calcification or regional wall motion abnormalities.  Her most recent lipid profile showed elevated cholesterol 223 and elevated LDL at 133, but her HDL was also great at  75.  She does not want to take lipid-lowering medicines that she "does not have to".  Ms. 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 She denies any difficulty breathing associated with this.   History of asbestosis for which patient follows with Dr. Pearlie Oyster. 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   Past Medical History:  Diagnosis Date   Elevated LDL cholesterol level 2013   mild   Seasonal allergies    SOB (shortness of breath)    Systemic hypertension    Current Medications: Current Meds  Medication Sig   acyclovir (ZOVIRAX) 200 MG capsule Take 200 mg by mouth as needed.   albuterol (VENTOLIN HFA) 108 (90 Base) MCG/ACT inhaler Inhale 2 puffs into the lungs every 6 (six) hours as needed for wheezing.   azelastine (OPTIVAR) 0.05 % ophthalmic solution Place 1 drop into both eyes daily.   fexofenadine (ALLEGRA) 180 MG tablet Take 180 mg by mouth daily.   losartan (COZAAR) 100 MG tablet Take 100 mg by mouth daily.   phentermine (ADIPEX-P) 37.5 MG tablet Take 37.5 mg by mouth daily.     Allergies:   Sulfa antibiotics   Social History   Socioeconomic History   Marital status: Divorced    Spouse name: Not on file   Number of children: Not on file   Years of education: Not on file   Highest education  level: Not on file  Occupational History   Not on file  Tobacco Use   Smoking status: Never   Smokeless tobacco: Never  Vaping Use   Vaping Use: Never used  Substance and Sexual Activity   Alcohol use: Not Currently   Drug use: Never   Sexual activity: Not Currently  Other Topics Concern   Not on file  Social History Narrative   Not on file   Social Determinants of Health   Financial Resource Strain: Not on file  Food Insecurity: Not on file  Transportation Needs: Not on file  Physical  Activity: Not on file  Stress: Not on file  Social Connections: Not on file    Family History: Mother: Stroke at the age of 29.  Father: MI, CAD  Brother: Pancreatitic cancer Brother: CAD with stenting   Two other brothers without health conditions noted.   ROS:   Please see the history of present illness.     All other systems reviewed and are negative.  EKGs/Labs/Other Studies Reviewed:    EKG:  EKG ordered today personal review showed normal sinus rhythm, generalized low voltage QRS, T wave inversion across the anterior precordium from V1 to V6, QTC 452 ms, no change from previous tracing  Recent Labs: No results found for requested labs within last 8760 hours.  10/09/2019 K 5.0,Hgb 13.2, glucose 94, creatinine 0.9 Recent Lipid Panel No results found for: CHOL, TRIG, HDL, CHOLHDL, VLDL, LDLCALC, LDLDIRECT 10/09/2019 CHOL 197, TG 132, LDL 111, HDL 63   10/22/2020 Cholesterol 223, HDL 75, LDL 133, triglycerides 87 Hemoglobin 13.8, creatinine 0.94 potassium 4.7, normal liver function test Physical Exam:    VS:  BP 118/68 (BP Location: Left Arm, Patient Position: Sitting, Cuff Size: Normal)   Pulse 86   Resp 20   Ht '5\' 6"'$  (1.676 m)   Wt 178 lb 6.4 oz (80.9 kg)   SpO2 95%   BMI 28.79 kg/m     Wt Readings from Last 3 Encounters:  01/19/21 178 lb 6.4 oz (80.9 kg)  05/29/20 173 lb (78.5 kg)  11/30/19 185 lb (83.9 kg)     General: Alert, oriented x3, no distress, overweight Head: no evidence of trauma, PERRL, EOMI, no exophtalmos or lid lag, no myxedema, no xanthelasma; normal ears, nose and oropharynx Neck: normal jugular venous pulsations and no hepatojugular reflux; brisk carotid pulses without delay and no carotid bruits Chest: clear to auscultation, no signs of consolidation by percussion or palpation, normal fremitus, symmetrical and full respiratory excursions Cardiovascular: normal position and quality of the apical impulse, regular rhythm, normal first and  second heart sounds, no murmurs, rubs or gallops Abdomen: no tenderness or distention, no masses by palpation, no abnormal pulsatility or arterial bruits, normal bowel sounds, no hepatosplenomegaly Extremities: no clubbing, cyanosis or edema; 2+ radial, ulnar and brachial pulses bilaterally; 2+ right femoral, posterior tibial and dorsalis pedis pulses; 2+ left femoral, posterior tibial and dorsalis pedis pulses; no subclavian or femoral bruits Neurological: grossly nonfocal Psych: Normal mood and affect   ASSESSMENT:    1. Coronary artery calcification seen on CT scan   2. Essential hypertension   3. Status post pericardial cyst excision   4. Overweight   5. History of asbestosis     PLAN:    In order of problems listed above:  Mild coronary artery calcifications seen on CT imaging: Patient had Myoview stress test in 2013 that did not show ischemia.  Low risk treadmill stress test in 2021.  Denies exertional or resting angina. Risk factors include HLD and family history, but no obesity, tobacco use, diabetes. EKG is stable without concerning signs.  Ideally would like her LDL cholesterol lower than 70, but she is reluctant to take any medications.  Wants to try to lose weight.  Actively engaged in the bariatric center program.  Discussed healthy diet. HTN: Well-controlled.  Had occasional symptoms of orthostatic hypotension with the weather was really hot.  Advised her to stay well-hydrated. Pericardial Cyst: Previously removal in [id number] complicated by right phrenic nerve damage. CT imaging shows compressive atelectasis due to hemidiaphragmatic paralysis.  Echocardiogram 2021 shows no evidence of recurrent pericardial cyst, pericardial thickening or calcification or hemodynamics consistent with constriction. Overweight: Discussed the limitations of phentermine as a weight loss drug and the potential side effects of valvular heart disease or pulmonary hypertension, although these appear to be  much lower than with a combination containing fenfluramine.  Also reviewed the very limited benefit phentermine has an weight loss, especially when looking at long-term results.  Reviewed the much better results with semaglutide [facility]), but I do not think she qualifies for treatment with this since she is her BMI is only 29 and she does not have much in the way of comorbid conditions. Asbestosis: Due to work exposure. Follows with Dr. Pearlie Oyster.  Medication Adjustments/Labs and Tests Ordered: Current medicines are reviewed at length with the patient today.  Concerns regarding medicines are outlined above.  Orders Placed This Encounter  Procedures   EKG 12-Lead    No orders of the defined types were placed in this encounter.   Patient Instructions  Medication Instructions:  No changes *If you need a refill on your cardiac medications before your next appointment, please call your pharmacy*   Lab Work: None ordered If you have labs (blood work) drawn today and your tests are completely normal, you will receive your results only by: [place] (if you have MyChart) OR A paper copy in the mail If you have any lab test that is abnormal or we need to change your treatment, we will call you to review the results.   Testing/Procedures: None ordered   Follow-Up: At [facility], you and your health needs are our priority.  As part of our continuing mission to provide you with exceptional heart care, we have created designated Provider Care Teams.  These Care Teams include your primary Cardiologist (physician) and Advanced Practice Providers (APPs -  Physician Assistants and Nurse Practitioners) who all work together to provide you with the care you need, when you need it.  We recommend signing up for the patient portal called "MyChart".  Sign up information is provided on this After Visit Summary.  MyChart is used to connect with patients for Virtual Visits (Telemedicine).  Patients  are able to view lab/test results, encounter notes, upcoming appointments, etc.  Non-urgent messages can be sent to your provider as well.   To learn more about what you can do with MyChart, go to [email or web address].    Your next appointment:   12 month(s)  The format for your next appointment:   In Person  Provider:   You may see Sanda Klein, MD or one of the following Advanced Practice Providers on your designated Care Team:   Almyra Deforest, PA-C Fabian Sharp, PA-C or  Roby Lofts, PA-C     Sanda Klein, MD, [facility] [phone] office [phone] pager

## 2021-01-19 NOTE — Patient Instructions (Signed)

## 2021-01-21 DIAGNOSIS — J329 Chronic sinusitis, unspecified: Secondary | ICD-10-CM | POA: Diagnosis not present

## 2021-01-21 DIAGNOSIS — H8112 Benign paroxysmal vertigo, left ear: Secondary | ICD-10-CM | POA: Diagnosis not present

## 2021-01-21 DIAGNOSIS — I10 Essential (primary) hypertension: Secondary | ICD-10-CM | POA: Diagnosis not present

## 2021-01-25 DIAGNOSIS — E042 Nontoxic multinodular goiter: Secondary | ICD-10-CM | POA: Diagnosis not present

## 2021-07-19 DIAGNOSIS — Z85828 Personal history of other malignant neoplasm of skin: Secondary | ICD-10-CM | POA: Diagnosis not present

## 2021-07-19 DIAGNOSIS — L814 Other melanin hyperpigmentation: Secondary | ICD-10-CM | POA: Diagnosis not present

## 2021-07-19 DIAGNOSIS — Z23 Encounter for immunization: Secondary | ICD-10-CM | POA: Diagnosis not present

## 2021-07-19 DIAGNOSIS — L578 Other skin changes due to chronic exposure to nonionizing radiation: Secondary | ICD-10-CM | POA: Diagnosis not present

## 2021-07-19 DIAGNOSIS — L570 Actinic keratosis: Secondary | ICD-10-CM | POA: Diagnosis not present

## 2021-07-19 DIAGNOSIS — L821 Other seborrheic keratosis: Secondary | ICD-10-CM | POA: Diagnosis not present

## 2021-07-19 DIAGNOSIS — D225 Melanocytic nevi of trunk: Secondary | ICD-10-CM | POA: Diagnosis not present

## 2021-07-19 DIAGNOSIS — D485 Neoplasm of uncertain behavior of skin: Secondary | ICD-10-CM | POA: Diagnosis not present

## 2021-08-17 DIAGNOSIS — Z1231 Encounter for screening mammogram for malignant neoplasm of breast: Secondary | ICD-10-CM | POA: Diagnosis not present

## 2021-10-22 DIAGNOSIS — S338XXA Sprain of other parts of lumbar spine and pelvis, initial encounter: Secondary | ICD-10-CM | POA: Diagnosis not present

## 2021-10-22 DIAGNOSIS — M9903 Segmental and somatic dysfunction of lumbar region: Secondary | ICD-10-CM | POA: Diagnosis not present

## 2021-10-22 DIAGNOSIS — S233XXA Sprain of ligaments of thoracic spine, initial encounter: Secondary | ICD-10-CM | POA: Diagnosis not present

## 2021-10-22 DIAGNOSIS — M9901 Segmental and somatic dysfunction of cervical region: Secondary | ICD-10-CM | POA: Diagnosis not present

## 2021-10-22 DIAGNOSIS — S134XXA Sprain of ligaments of cervical spine, initial encounter: Secondary | ICD-10-CM | POA: Diagnosis not present

## 2021-10-22 DIAGNOSIS — M9902 Segmental and somatic dysfunction of thoracic region: Secondary | ICD-10-CM | POA: Diagnosis not present

## 2021-10-26 DIAGNOSIS — I10 Essential (primary) hypertension: Secondary | ICD-10-CM | POA: Diagnosis not present

## 2021-10-26 DIAGNOSIS — M62830 Muscle spasm of back: Secondary | ICD-10-CM | POA: Diagnosis not present

## 2021-10-26 DIAGNOSIS — S39012A Strain of muscle, fascia and tendon of lower back, initial encounter: Secondary | ICD-10-CM | POA: Diagnosis not present

## 2021-10-26 DIAGNOSIS — M461 Sacroiliitis, not elsewhere classified: Secondary | ICD-10-CM | POA: Diagnosis not present

## 2021-10-27 DIAGNOSIS — M9901 Segmental and somatic dysfunction of cervical region: Secondary | ICD-10-CM | POA: Diagnosis not present

## 2021-10-27 DIAGNOSIS — M9903 Segmental and somatic dysfunction of lumbar region: Secondary | ICD-10-CM | POA: Diagnosis not present

## 2021-10-27 DIAGNOSIS — M9902 Segmental and somatic dysfunction of thoracic region: Secondary | ICD-10-CM | POA: Diagnosis not present

## 2021-10-27 DIAGNOSIS — S134XXA Sprain of ligaments of cervical spine, initial encounter: Secondary | ICD-10-CM | POA: Diagnosis not present

## 2021-10-27 DIAGNOSIS — S233XXA Sprain of ligaments of thoracic spine, initial encounter: Secondary | ICD-10-CM | POA: Diagnosis not present

## 2021-11-03 DIAGNOSIS — S233XXA Sprain of ligaments of thoracic spine, initial encounter: Secondary | ICD-10-CM | POA: Diagnosis not present

## 2021-11-03 DIAGNOSIS — M9903 Segmental and somatic dysfunction of lumbar region: Secondary | ICD-10-CM | POA: Diagnosis not present

## 2021-11-03 DIAGNOSIS — S134XXA Sprain of ligaments of cervical spine, initial encounter: Secondary | ICD-10-CM | POA: Diagnosis not present

## 2021-11-03 DIAGNOSIS — M9901 Segmental and somatic dysfunction of cervical region: Secondary | ICD-10-CM | POA: Diagnosis not present

## 2021-11-03 DIAGNOSIS — M9902 Segmental and somatic dysfunction of thoracic region: Secondary | ICD-10-CM | POA: Diagnosis not present

## 2021-11-03 DIAGNOSIS — S338XXA Sprain of other parts of lumbar spine and pelvis, initial encounter: Secondary | ICD-10-CM | POA: Diagnosis not present

## 2021-11-09 DIAGNOSIS — S338XXA Sprain of other parts of lumbar spine and pelvis, initial encounter: Secondary | ICD-10-CM | POA: Diagnosis not present

## 2021-11-09 DIAGNOSIS — S134XXA Sprain of ligaments of cervical spine, initial encounter: Secondary | ICD-10-CM | POA: Diagnosis not present

## 2021-11-09 DIAGNOSIS — M9902 Segmental and somatic dysfunction of thoracic region: Secondary | ICD-10-CM | POA: Diagnosis not present

## 2021-11-09 DIAGNOSIS — S233XXA Sprain of ligaments of thoracic spine, initial encounter: Secondary | ICD-10-CM | POA: Diagnosis not present

## 2021-11-09 DIAGNOSIS — M9901 Segmental and somatic dysfunction of cervical region: Secondary | ICD-10-CM | POA: Diagnosis not present

## 2021-11-09 DIAGNOSIS — M9903 Segmental and somatic dysfunction of lumbar region: Secondary | ICD-10-CM | POA: Diagnosis not present

## 2021-11-12 DIAGNOSIS — J61 Pneumoconiosis due to asbestos and other mineral fibers: Secondary | ICD-10-CM | POA: Diagnosis not present

## 2021-11-12 DIAGNOSIS — Z0001 Encounter for general adult medical examination with abnormal findings: Secondary | ICD-10-CM | POA: Diagnosis not present

## 2021-11-12 DIAGNOSIS — I10 Essential (primary) hypertension: Secondary | ICD-10-CM | POA: Diagnosis not present

## 2021-11-12 DIAGNOSIS — E039 Hypothyroidism, unspecified: Secondary | ICD-10-CM | POA: Diagnosis not present

## 2021-11-12 DIAGNOSIS — D518 Other vitamin B12 deficiency anemias: Secondary | ICD-10-CM | POA: Diagnosis not present

## 2021-11-12 DIAGNOSIS — E559 Vitamin D deficiency, unspecified: Secondary | ICD-10-CM | POA: Diagnosis not present

## 2021-11-17 DIAGNOSIS — S134XXA Sprain of ligaments of cervical spine, initial encounter: Secondary | ICD-10-CM | POA: Diagnosis not present

## 2021-11-17 DIAGNOSIS — S338XXA Sprain of other parts of lumbar spine and pelvis, initial encounter: Secondary | ICD-10-CM | POA: Diagnosis not present

## 2021-11-17 DIAGNOSIS — M9902 Segmental and somatic dysfunction of thoracic region: Secondary | ICD-10-CM | POA: Diagnosis not present

## 2021-11-17 DIAGNOSIS — M9901 Segmental and somatic dysfunction of cervical region: Secondary | ICD-10-CM | POA: Diagnosis not present

## 2021-11-17 DIAGNOSIS — M9903 Segmental and somatic dysfunction of lumbar region: Secondary | ICD-10-CM | POA: Diagnosis not present

## 2021-11-17 DIAGNOSIS — S233XXA Sprain of ligaments of thoracic spine, initial encounter: Secondary | ICD-10-CM | POA: Diagnosis not present

## 2021-12-01 DIAGNOSIS — M9903 Segmental and somatic dysfunction of lumbar region: Secondary | ICD-10-CM | POA: Diagnosis not present

## 2021-12-01 DIAGNOSIS — S233XXA Sprain of ligaments of thoracic spine, initial encounter: Secondary | ICD-10-CM | POA: Diagnosis not present

## 2021-12-01 DIAGNOSIS — S134XXA Sprain of ligaments of cervical spine, initial encounter: Secondary | ICD-10-CM | POA: Diagnosis not present

## 2021-12-01 DIAGNOSIS — S338XXA Sprain of other parts of lumbar spine and pelvis, initial encounter: Secondary | ICD-10-CM | POA: Diagnosis not present

## 2021-12-01 DIAGNOSIS — M9902 Segmental and somatic dysfunction of thoracic region: Secondary | ICD-10-CM | POA: Diagnosis not present

## 2021-12-01 DIAGNOSIS — M9901 Segmental and somatic dysfunction of cervical region: Secondary | ICD-10-CM | POA: Diagnosis not present

## 2021-12-14 DIAGNOSIS — D485 Neoplasm of uncertain behavior of skin: Secondary | ICD-10-CM | POA: Diagnosis not present

## 2021-12-14 DIAGNOSIS — L859 Epidermal thickening, unspecified: Secondary | ICD-10-CM | POA: Diagnosis not present

## 2021-12-14 DIAGNOSIS — L988 Other specified disorders of the skin and subcutaneous tissue: Secondary | ICD-10-CM | POA: Diagnosis not present

## 2021-12-14 DIAGNOSIS — L570 Actinic keratosis: Secondary | ICD-10-CM | POA: Diagnosis not present

## 2021-12-15 DIAGNOSIS — M9901 Segmental and somatic dysfunction of cervical region: Secondary | ICD-10-CM | POA: Diagnosis not present

## 2021-12-15 DIAGNOSIS — S233XXA Sprain of ligaments of thoracic spine, initial encounter: Secondary | ICD-10-CM | POA: Diagnosis not present

## 2021-12-15 DIAGNOSIS — S338XXA Sprain of other parts of lumbar spine and pelvis, initial encounter: Secondary | ICD-10-CM | POA: Diagnosis not present

## 2021-12-15 DIAGNOSIS — M9902 Segmental and somatic dysfunction of thoracic region: Secondary | ICD-10-CM | POA: Diagnosis not present

## 2021-12-15 DIAGNOSIS — S134XXA Sprain of ligaments of cervical spine, initial encounter: Secondary | ICD-10-CM | POA: Diagnosis not present

## 2021-12-15 DIAGNOSIS — M9903 Segmental and somatic dysfunction of lumbar region: Secondary | ICD-10-CM | POA: Diagnosis not present

## 2021-12-29 DIAGNOSIS — M9902 Segmental and somatic dysfunction of thoracic region: Secondary | ICD-10-CM | POA: Diagnosis not present

## 2021-12-29 DIAGNOSIS — M9903 Segmental and somatic dysfunction of lumbar region: Secondary | ICD-10-CM | POA: Diagnosis not present

## 2021-12-29 DIAGNOSIS — S233XXA Sprain of ligaments of thoracic spine, initial encounter: Secondary | ICD-10-CM | POA: Diagnosis not present

## 2021-12-29 DIAGNOSIS — S134XXA Sprain of ligaments of cervical spine, initial encounter: Secondary | ICD-10-CM | POA: Diagnosis not present

## 2021-12-29 DIAGNOSIS — S338XXA Sprain of other parts of lumbar spine and pelvis, initial encounter: Secondary | ICD-10-CM | POA: Diagnosis not present

## 2021-12-29 DIAGNOSIS — M9901 Segmental and somatic dysfunction of cervical region: Secondary | ICD-10-CM | POA: Diagnosis not present

## 2022-01-13 DIAGNOSIS — M9901 Segmental and somatic dysfunction of cervical region: Secondary | ICD-10-CM | POA: Diagnosis not present

## 2022-01-13 DIAGNOSIS — S233XXA Sprain of ligaments of thoracic spine, initial encounter: Secondary | ICD-10-CM | POA: Diagnosis not present

## 2022-01-13 DIAGNOSIS — S338XXA Sprain of other parts of lumbar spine and pelvis, initial encounter: Secondary | ICD-10-CM | POA: Diagnosis not present

## 2022-01-13 DIAGNOSIS — M9903 Segmental and somatic dysfunction of lumbar region: Secondary | ICD-10-CM | POA: Diagnosis not present

## 2022-01-13 DIAGNOSIS — S134XXA Sprain of ligaments of cervical spine, initial encounter: Secondary | ICD-10-CM | POA: Diagnosis not present

## 2022-01-13 DIAGNOSIS — M9902 Segmental and somatic dysfunction of thoracic region: Secondary | ICD-10-CM | POA: Diagnosis not present

## 2022-01-27 DIAGNOSIS — M9903 Segmental and somatic dysfunction of lumbar region: Secondary | ICD-10-CM | POA: Diagnosis not present

## 2022-01-27 DIAGNOSIS — S134XXA Sprain of ligaments of cervical spine, initial encounter: Secondary | ICD-10-CM | POA: Diagnosis not present

## 2022-01-27 DIAGNOSIS — M9901 Segmental and somatic dysfunction of cervical region: Secondary | ICD-10-CM | POA: Diagnosis not present

## 2022-01-27 DIAGNOSIS — S233XXA Sprain of ligaments of thoracic spine, initial encounter: Secondary | ICD-10-CM | POA: Diagnosis not present

## 2022-01-27 DIAGNOSIS — S338XXA Sprain of other parts of lumbar spine and pelvis, initial encounter: Secondary | ICD-10-CM | POA: Diagnosis not present

## 2022-01-27 DIAGNOSIS — M9902 Segmental and somatic dysfunction of thoracic region: Secondary | ICD-10-CM | POA: Diagnosis not present

## 2022-02-02 DIAGNOSIS — E042 Nontoxic multinodular goiter: Secondary | ICD-10-CM | POA: Diagnosis not present

## 2022-02-10 DIAGNOSIS — S338XXA Sprain of other parts of lumbar spine and pelvis, initial encounter: Secondary | ICD-10-CM | POA: Diagnosis not present

## 2022-02-10 DIAGNOSIS — M9902 Segmental and somatic dysfunction of thoracic region: Secondary | ICD-10-CM | POA: Diagnosis not present

## 2022-02-10 DIAGNOSIS — M9903 Segmental and somatic dysfunction of lumbar region: Secondary | ICD-10-CM | POA: Diagnosis not present

## 2022-02-10 DIAGNOSIS — S134XXA Sprain of ligaments of cervical spine, initial encounter: Secondary | ICD-10-CM | POA: Diagnosis not present

## 2022-02-10 DIAGNOSIS — M9901 Segmental and somatic dysfunction of cervical region: Secondary | ICD-10-CM | POA: Diagnosis not present

## 2022-02-10 DIAGNOSIS — S233XXA Sprain of ligaments of thoracic spine, initial encounter: Secondary | ICD-10-CM | POA: Diagnosis not present

## 2022-02-17 DIAGNOSIS — M9903 Segmental and somatic dysfunction of lumbar region: Secondary | ICD-10-CM | POA: Diagnosis not present

## 2022-02-17 DIAGNOSIS — S338XXA Sprain of other parts of lumbar spine and pelvis, initial encounter: Secondary | ICD-10-CM | POA: Diagnosis not present

## 2022-02-17 DIAGNOSIS — M9901 Segmental and somatic dysfunction of cervical region: Secondary | ICD-10-CM | POA: Diagnosis not present

## 2022-02-17 DIAGNOSIS — M9902 Segmental and somatic dysfunction of thoracic region: Secondary | ICD-10-CM | POA: Diagnosis not present

## 2022-02-17 DIAGNOSIS — S134XXA Sprain of ligaments of cervical spine, initial encounter: Secondary | ICD-10-CM | POA: Diagnosis not present

## 2022-02-17 DIAGNOSIS — S233XXA Sprain of ligaments of thoracic spine, initial encounter: Secondary | ICD-10-CM | POA: Diagnosis not present

## 2022-02-24 DIAGNOSIS — S338XXA Sprain of other parts of lumbar spine and pelvis, initial encounter: Secondary | ICD-10-CM | POA: Diagnosis not present

## 2022-02-24 DIAGNOSIS — S233XXA Sprain of ligaments of thoracic spine, initial encounter: Secondary | ICD-10-CM | POA: Diagnosis not present

## 2022-02-24 DIAGNOSIS — M9902 Segmental and somatic dysfunction of thoracic region: Secondary | ICD-10-CM | POA: Diagnosis not present

## 2022-02-24 DIAGNOSIS — M9903 Segmental and somatic dysfunction of lumbar region: Secondary | ICD-10-CM | POA: Diagnosis not present

## 2022-02-24 DIAGNOSIS — S134XXA Sprain of ligaments of cervical spine, initial encounter: Secondary | ICD-10-CM | POA: Diagnosis not present

## 2022-02-24 DIAGNOSIS — M9901 Segmental and somatic dysfunction of cervical region: Secondary | ICD-10-CM | POA: Diagnosis not present

## 2022-03-16 DIAGNOSIS — M9902 Segmental and somatic dysfunction of thoracic region: Secondary | ICD-10-CM | POA: Diagnosis not present

## 2022-03-16 DIAGNOSIS — M9901 Segmental and somatic dysfunction of cervical region: Secondary | ICD-10-CM | POA: Diagnosis not present

## 2022-03-16 DIAGNOSIS — S134XXA Sprain of ligaments of cervical spine, initial encounter: Secondary | ICD-10-CM | POA: Diagnosis not present

## 2022-03-16 DIAGNOSIS — S338XXA Sprain of other parts of lumbar spine and pelvis, initial encounter: Secondary | ICD-10-CM | POA: Diagnosis not present

## 2022-03-16 DIAGNOSIS — M9903 Segmental and somatic dysfunction of lumbar region: Secondary | ICD-10-CM | POA: Diagnosis not present

## 2022-03-16 DIAGNOSIS — S233XXA Sprain of ligaments of thoracic spine, initial encounter: Secondary | ICD-10-CM | POA: Diagnosis not present

## 2022-03-24 DIAGNOSIS — S233XXA Sprain of ligaments of thoracic spine, initial encounter: Secondary | ICD-10-CM | POA: Diagnosis not present

## 2022-03-24 DIAGNOSIS — M9903 Segmental and somatic dysfunction of lumbar region: Secondary | ICD-10-CM | POA: Diagnosis not present

## 2022-03-24 DIAGNOSIS — S338XXA Sprain of other parts of lumbar spine and pelvis, initial encounter: Secondary | ICD-10-CM | POA: Diagnosis not present

## 2022-03-24 DIAGNOSIS — S134XXA Sprain of ligaments of cervical spine, initial encounter: Secondary | ICD-10-CM | POA: Diagnosis not present

## 2022-03-24 DIAGNOSIS — M9902 Segmental and somatic dysfunction of thoracic region: Secondary | ICD-10-CM | POA: Diagnosis not present

## 2022-03-24 DIAGNOSIS — M9901 Segmental and somatic dysfunction of cervical region: Secondary | ICD-10-CM | POA: Diagnosis not present

## 2022-03-29 ENCOUNTER — Encounter: Payer: Self-pay | Admitting: Cardiovascular Disease

## 2022-03-29 ENCOUNTER — Ambulatory Visit: Payer: Medicare Other | Attending: Cardiovascular Disease | Admitting: Cardiovascular Disease

## 2022-03-29 VITALS — BP 134/70 | HR 76 | Ht 66.0 in | Wt 186.0 lb

## 2022-03-29 DIAGNOSIS — R072 Precordial pain: Secondary | ICD-10-CM

## 2022-03-29 DIAGNOSIS — Z8709 Personal history of other diseases of the respiratory system: Secondary | ICD-10-CM | POA: Diagnosis not present

## 2022-03-29 DIAGNOSIS — E7800 Pure hypercholesterolemia, unspecified: Secondary | ICD-10-CM

## 2022-03-29 DIAGNOSIS — Z8774 Personal history of (corrected) congenital malformations of heart and circulatory system: Secondary | ICD-10-CM

## 2022-03-29 DIAGNOSIS — I10 Essential (primary) hypertension: Secondary | ICD-10-CM

## 2022-03-29 DIAGNOSIS — E663 Overweight: Secondary | ICD-10-CM

## 2022-03-29 MED ORDER — METOPROLOL TARTRATE 100 MG PO TABS: ORAL_TABLET | ORAL | 0 refills | Status: DC

## 2022-03-29 NOTE — Progress Notes (Signed)
Cardiology Consult Note:    Date:  04/01/2022   ID:  Alexandria Peters, DOB 09/03/1950, MRN [id number]  PCP:  Ginger Organ  [facility] Cardiologist:  Sanda Klein, MD  [facility] Electrophysiologist:  None   Referring MD: [name], PA-C   CC: Chest pain    History of Present Illness:    Alexandria Peters is a 71 y.o. 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  She has a chronically abnormal ECG with low QRS voltage and inverted T waves across all the precordial leads.  She is doing better since her last appointment.  She has occasional left shoulder pain that is clearly positional.  However, occasionally has some chest pressure at rest that may worsen with physical activity.  She had a normal Myoview study in 2013 and a low risk plain treadmill stress test in 2021, also normal echo in 2021.  She does have coronary calcification seen on chest CT, but no pericardial calcification.  She has chronic shortness of breath and occasional wheezing and bears a diagnosis of asbestosis.  Denies active cough or hemoptysis.  Does not have orthopnea, PND or lower extremity edema.  She has lost substantial weight and now has a BMI that is borderline obese at 30.  She does not have lower extremity edema.  Claudication and focal neurological complaints.  Lipid profile is largely unchanged with an elevated LDL of 131 but also an excellent HDL of 71.  She wants to avoid taking liquid lowering medications if she can.  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 She denies any difficulty breathing associated with this.   History of asbestosis for which patient follows with Dr. Pearlie Oyster. Patient states she  had worked in [employer] that led to her work exposure.   Past Medical History:  Diagnosis Date   Elevated LDL cholesterol level 2013   mild   Seasonal allergies    SOB (shortness of breath)    Systemic hypertension    Current Medications: Current Meds  Medication Sig   albuterol (VENTOLIN HFA) 108 (90 Base) MCG/ACT inhaler Inhale 2 puffs into the lungs every 6 (six) hours as needed for wheezing.   cetirizine (ZYRTEC) 10 MG tablet Take 10 mg by mouth daily.   Coenzyme Q10 100 MG capsule Take 100 mg by mouth daily.   fexofenadine (ALLEGRA) 180 MG tablet Take 180 mg by mouth daily.   losartan (COZAAR) 100 MG tablet Take 100 mg by mouth daily.   metoprolol tartrate (LOPRESSOR) 100 MG tablet Take 100 mg  2 hours before Coronary CT   rosuvastatin (CRESTOR) 10 MG tablet Take 10 mg by mouth at bedtime.    Allergies:   Sulfa antibiotics   Social History   Socioeconomic History   Marital status: Divorced    Spouse name: Not on file   Number of children: Not on file   Years of education: Not on file   Highest education level: Not on file  Occupational History   Not on file  Tobacco Use   Smoking status: Never   Smokeless tobacco: Never  Vaping Use   Vaping Use: Never used  Substance and Sexual Activity   Alcohol use: Not Currently   Drug use:  Never   Sexual activity: Not Currently  Other Topics Concern   Not on file  Social History Narrative   Not on file   Social Determinants of Health   Financial Resource Strain: Not on file  Food Insecurity: Not on file  Transportation Needs: Not on file  Physical Activity: Not on file  Stress: Not on file  Social Connections: Not on file    Family History: Mother: Stroke at the age of 64.  Father: MI, CAD  Brother: Pancreatitic cancer Brother: CAD with stenting   Two other brothers without health conditions noted.   ROS:   Please see the history of present illness.     All other systems reviewed and are  negative.  EKGs/Labs/Other Studies Reviewed:    EKG:  EKG ordered today and personally reviewed shows normal sinus rhythm, generalized low voltage QRS, chronic T wave inversion across all the precordial leads, QTc 483 ms, unchanged from previous tracings g  Recent Labs: 03/29/2022: BUN 19; Creatinine, Ser 0.80; Potassium 4.5; Sodium 143  10/09/2019 K 5.0,Hgb 13.2, glucose 94, creatinine 0.9 Recent Lipid Panel    Component Value Date/Time   CHOL 172 03/29/2022 1228   TRIG 100 03/29/2022 1228   HDL 77 03/29/2022 1228   CHOLHDL 2.2 03/29/2022 1228   LDLCALC 77 03/29/2022 1228   10/09/2019 CHOL 197, TG 132, LDL 111, HDL 63   10/22/2020 Cholesterol 223, HDL 75, LDL 133, triglycerides 87 Hemoglobin 13.8, creatinine 0.94 potassium 4.7, normal liver function test  11/13/2021 Cholesterol 225, HDL 71, LDL 131, triglycerides 131, Hemoglobin 12.7, creatinine 0.5, potassium 4.6, ALT 19 Physical Exam:    VS:  BP 134/70 (BP Location: Left Arm, Patient Position: Sitting, Cuff Size: Large)   Pulse 76   Ht '5\' 6"'$  (1.676 m)   Wt 84.4 kg   SpO2 96%   BMI 30.02 kg/m     Wt Readings from Last 3 Encounters:  03/29/22 84.4 kg  01/19/21 80.9 kg  05/29/20 78.5 kg      General: Alert, oriented x3, no distress, borderline obese Head: no evidence of trauma, PERRL, EOMI, no exophtalmos or lid lag, no myxedema, no xanthelasma; normal ears, nose and oropharynx Neck: normal jugular venous pulsations and no hepatojugular reflux; brisk carotid pulses without delay and no carotid bruits Chest: Has a strange whooping wheezing sound in her right lung and some pleural rubs,, no signs of consolidation by percussion or palpation, normal fremitus, symmetrical and full respiratory excursions Cardiovascular: normal position and quality of the apical impulse, regular rhythm, normal first and second heart sounds, no murmurs, rubs or gallops Abdomen: no tenderness or distention, no masses by palpation, no abnormal  pulsatility or arterial bruits, normal bowel sounds, no hepatosplenomegaly Extremities: no clubbing, cyanosis or edema; 2+ radial, ulnar and brachial pulses bilaterally; 2+ right femoral, posterior tibial and dorsalis pedis pulses; 2+ left femoral, posterior tibial and dorsalis pedis pulses; no subclavian or femoral bruits Neurological: grossly nonfocal Psych: Normal mood and affect    ASSESSMENT:    1. Precordial pain   2. Essential hypertension   3. Hypercholesterolemia   4. Status post pericardial cyst excision   5. Overweight   6. History of asbestosis      PLAN:    In order of problems listed above:  Chest discomfort: Coronary calcification seen on CT chest.  Patient had Myoview stress test in 2013 that did not show ischemia.  Low risk treadmill stress test in 2021.  Scheduled for coronary CT angiography  HTN: Adequate control on monotherapy with losartan.  No recent symptoms of orthostatic hypotension. HLP: She is reluctant to take lipid-lowering medications, although I think she would have a better long-term outlook with LDL less than 70.  Getting a precise evaluation of her coronary anatomy and the coronary calcium score will help fine-tune her recommendations for lipid-lowering therapy. Pericardial Cyst: Previously removal in 9528 complicated by right phrenic nerve damage. CT imaging shows compressive atelectasis due to hemidiaphragmatic paralysis.  Echocardiogram 2021 shows no evidence of recurrent pericardial cyst, pericardial thickening or calcification or hemodynamics consistent with constriction. Overweight: Managing to maintain her BMI right around 30. Asbestosis: Due to work exposure. Follows with Dr. Pearlie Oyster.  Medication Adjustments/Labs and Tests Ordered: Current medicines are reviewed at length with the patient today.  Concerns regarding medicines are outlined above.  Orders Placed This Encounter  Procedures   CT CORONARY MORPH W/CTA COR W/SCORE W/CA W/CM &/OR  WO/CM   Basic metabolic panel   Lipid panel    Meds ordered this encounter  Medications   metoprolol tartrate (LOPRESSOR) 100 MG tablet    Sig: Take 100 mg  2 hours before Coronary CT    Dispense:  1 tablet    Refill:  0     Patient Instructions  Medication Instructions:  Continue same medications *If you need a refill on your cardiac medications before your next appointment, please call your pharmacy*   Lab Work: Bmet and lipid panel today   Testing/Procedures: Coronary CT   will be scheduled after approved by insurance     Follow instructions below    Follow-Up: At [facility], you and your health needs are our priority.  As part of our continuing mission to provide you with exceptional heart care, we have created designated Provider Care Teams.  These Care Teams include your primary Cardiologist (physician) and Advanced Practice Providers (APPs -  Physician Assistants and Nurse Practitioners) who all work together to provide you with the care you need, when you need it.  We recommend signing up for the patient portal called "MyChart".  Sign up information is provided on this After Visit Summary.  MyChart is used to connect with patients for Virtual Visits (Telemedicine).  Patients are able to view lab/test results, encounter notes, upcoming appointments, etc.  Non-urgent messages can be sent to your provider as well.   To learn more about what you can do with MyChart, go to [email or web address].    Your next appointment:  1 Year    Call in August to schedule Nov appointment     The format for your next appointment: Office   Provider: Dr.Addysen Louth       Your cardiac CT will be scheduled at one of the below locations:   [facility] [address] [place], [place] [address] [phone]  [facility] [address] [place], [place] [address] [phone]  [facility] [place], [place] [address] [phone]  If scheduled at [facility], please arrive at the [facility] and Children's Entrance (Entrance C2) of [facility] 30 minutes prior to test start time. You can use the FREE valet parking offered at entrance C (encouraged to control the heart rate for the test)  Proceed to the [facility] Radiology Department (first floor) to check-in and test prep.  All radiology patients and guests should use entrance C2 at [facility], accessed from  [address], even though the hospital's physical address listed is [address].    If scheduled at [facility] or [facility], please arrive 15 mins early for check-in and test prep.   Please follow these instructions carefully (unless otherwise directed):  Hold all erectile dysfunction medications at least 3 days (72 hrs) prior to test. (Ie viagra, cialis, sildenafil, tadalafil, etc) We will administer nitroglycerin during this exam.   On the Night Before the Test: Be sure to Drink plenty of water. Do not consume any caffeinated/decaffeinated beverages or chocolate 12 hours prior to your test. Do not take any antihistamines 12 hours prior to your test.   On the Day of the Test: Drink plenty of water until 1 hour prior to the test. Do not eat any food 1 hour prior to test. You may take your regular medications prior to the test.  Take metoprolol 100 mg two hours prior to test. HOLD Losartan  morning of the test.          After the Test: Drink plenty of water. After receiving IV contrast, you may experience a mild flushed feeling. This is normal. On occasion, you may experience a mild rash up to 24 hours after the test. This is not dangerous. If this occurs, you can take Benadryl 25 mg and increase your fluid intake. If you experience trouble breathing, this can be serious. If it is  severe call [phone] IMMEDIATELY. If it is mild, please call our office.   We will call to schedule your test 2-4 weeks out understanding that some insurance companies will need an authorization prior to the service being performed.   For non-scheduling related questions, please contact the cardiac imaging nurse navigator should you have any questions/concerns: Marchia Bond, Cardiac Imaging Nurse Navigator Gordy Clement, Cardiac Imaging Nurse Navigator [facility] Direct Office Dial: [phone]   For scheduling needs, including cancellations and rescheduling, please call [place], [phone].    Important Information About Sugar         Sanda Klein, MD, [facility] [phone] office [phone] pager

## 2022-03-29 NOTE — Patient Instructions (Addendum)
Medication Instructions:  Continue same medications *If you need a refill on your cardiac medications before your next appointment, please call your pharmacy*   Lab Work: Bmet and lipid panel today   Testing/Procedures: Coronary CT   will be scheduled after approved by insurance     Follow instructions below    Follow-Up: At [facility], you and your health needs are our priority.  As part of our continuing mission to provide you with exceptional heart care, we have created designated Provider Care Teams.  These Care Teams include your primary Cardiologist (physician) and Advanced Practice Providers (APPs -  Physician Assistants and Nurse Practitioners) who all work together to provide you with the care you need, when you need it.  We recommend signing up for the patient portal called "MyChart".  Sign up information is provided on this After Visit Summary.  MyChart is used to connect with patients for Virtual Visits (Telemedicine).  Patients are able to view lab/test results, encounter notes, upcoming appointments, etc.  Non-urgent messages can be sent to your provider as well.   To learn more about what you can do with MyChart, go to [email or web address].    Your next appointment:  1 Year    Call in August to schedule Nov appointment     The format for your next appointment: Office   Provider: Dr.Croitoru       Your cardiac CT will be scheduled at one of the below locations:   [facility] [address] [place], [place] [address] [phone]  [facility] [address] [place], [place] [address] [phone]  [facility] [place], [place] [address] [phone]  If scheduled at [facility], please arrive at the [facility] and Children's Entrance (Entrance C2) of [facility] 30 minutes prior to test start time. You can use the FREE  valet parking offered at entrance C (encouraged to control the heart rate for the test)  Proceed to the [facility] Radiology Department (first floor) to check-in and test prep.  All radiology patients and guests should use entrance C2 at [facility], accessed from [facility], even though the hospital's physical address listed is [address].    If scheduled at [facility] or [facility], please arrive 15 mins early for check-in and test prep.   Please follow these instructions carefully (unless otherwise directed):  Hold all erectile dysfunction medications at least 3 days (72 hrs) prior to test. (Ie viagra, cialis, sildenafil, tadalafil, etc) We will administer nitroglycerin during this exam.   On the Night Before the Test: Be sure to Drink plenty of water. Do not consume any caffeinated/decaffeinated beverages or chocolate 12 hours prior to your test. Do not take any antihistamines 12 hours prior to your test.   On the Day of the Test: Drink plenty of water until 1 hour prior to the test. Do not eat any food 1 hour prior to test. You may take your regular medications prior to the test.  Take metoprolol 100 mg two hours prior to test. HOLD Losartan  morning of the test.          After the Test: Drink plenty of water. After receiving IV contrast, you may experience a mild flushed feeling. This is normal. On occasion, you may experience a mild rash up to 24 hours after the test.  This is not dangerous. If this occurs, you can take Benadryl 25 mg and increase your fluid intake. If you experience trouble breathing, this can be serious. If it is severe call 911 IMMEDIATELY. If it is mild, please call our office.   We will call to schedule your test 2-4 weeks out understanding that some insurance companies will need an authorization prior to the service being performed.   For non-scheduling related  questions, please contact the cardiac imaging nurse navigator should you have any questions/concerns: Marchia Bond, Cardiac Imaging Nurse Navigator Gordy Clement, Cardiac Imaging Nurse Navigator [facility] Direct Office Dial: [phone]   For scheduling needs, including cancellations and rescheduling, please call [place], [phone].    Important Information About Sugar

## 2022-03-30 LAB — BASIC METABOLIC PANEL
BUN/Creatinine Ratio: 24 (ref 12–28)
BUN: 19 mg/dL (ref 8–27)
CO2: 24 mmol/L (ref 20–29)
Calcium: 9.8 mg/dL (ref 8.7–10.3)
Chloride: 104 mmol/L (ref 96–106)
Creatinine, Ser: 0.8 mg/dL (ref 0.57–1.00)
Glucose: 90 mg/dL (ref 70–99)
Potassium: 4.5 mmol/L (ref 3.5–5.2)
Sodium: 143 mmol/L (ref 134–144)
eGFR: 80 mL/min/{1.73_m2} (ref >59)

## 2022-03-30 LAB — LIPID PANEL
Chol/HDL Ratio: 2.2 ratio (ref 0.0–4.4)
Cholesterol, Total: 172 mg/dL (ref 100–199)
HDL: 77 mg/dL (ref >39)
LDL Chol Calc (NIH): 77 mg/dL (ref 0–99)
Triglycerides: 100 mg/dL (ref 0–149)
VLDL Cholesterol Cal: 18 mg/dL (ref 5–40)

## 2022-03-31 DIAGNOSIS — S134XXA Sprain of ligaments of cervical spine, initial encounter: Secondary | ICD-10-CM | POA: Diagnosis not present

## 2022-03-31 DIAGNOSIS — M9903 Segmental and somatic dysfunction of lumbar region: Secondary | ICD-10-CM | POA: Diagnosis not present

## 2022-03-31 DIAGNOSIS — M9902 Segmental and somatic dysfunction of thoracic region: Secondary | ICD-10-CM | POA: Diagnosis not present

## 2022-03-31 DIAGNOSIS — S338XXA Sprain of other parts of lumbar spine and pelvis, initial encounter: Secondary | ICD-10-CM | POA: Diagnosis not present

## 2022-03-31 DIAGNOSIS — M9901 Segmental and somatic dysfunction of cervical region: Secondary | ICD-10-CM | POA: Diagnosis not present

## 2022-03-31 DIAGNOSIS — S233XXA Sprain of ligaments of thoracic spine, initial encounter: Secondary | ICD-10-CM | POA: Diagnosis not present

## 2022-04-06 DIAGNOSIS — S233XXA Sprain of ligaments of thoracic spine, initial encounter: Secondary | ICD-10-CM | POA: Diagnosis not present

## 2022-04-06 DIAGNOSIS — M9901 Segmental and somatic dysfunction of cervical region: Secondary | ICD-10-CM | POA: Diagnosis not present

## 2022-04-06 DIAGNOSIS — S338XXA Sprain of other parts of lumbar spine and pelvis, initial encounter: Secondary | ICD-10-CM | POA: Diagnosis not present

## 2022-04-06 DIAGNOSIS — M9903 Segmental and somatic dysfunction of lumbar region: Secondary | ICD-10-CM | POA: Diagnosis not present

## 2022-04-06 DIAGNOSIS — M9902 Segmental and somatic dysfunction of thoracic region: Secondary | ICD-10-CM | POA: Diagnosis not present

## 2022-04-06 DIAGNOSIS — S134XXA Sprain of ligaments of cervical spine, initial encounter: Secondary | ICD-10-CM | POA: Diagnosis not present

## 2022-04-08 ENCOUNTER — Telehealth (HOSPITAL_COMMUNITY): Payer: Self-pay | Admitting: *Deleted

## 2022-04-08 NOTE — Telephone Encounter (Signed)
Reaching out to patient to offer assistance regarding upcoming cardiac imaging study; pt verbalizes understanding of appt date/time, parking situation and where to check in,  medications ordered, and verified current allergies; name and call back number provided for further questions should they arise  Gordy Clement RN Navigator Cardiac Imaging [facility] [phone] office [phone] cell  Patient to take '100mg'$  metoprolol tartrate two hours prior to her cardiac CT scan.  She is aware to arrive at 11:30am.

## 2022-04-12 ENCOUNTER — Ambulatory Visit (HOSPITAL_COMMUNITY)
Admission: RE | Admit: 2022-04-12 | Discharge: 2022-04-12 | Disposition: A | Discharge status: Home / Self Care | Payer: Medicare Other | Source: Ambulatory Visit | Attending: Cardiovascular Disease | Admitting: Cardiovascular Disease

## 2022-04-12 DIAGNOSIS — R072 Precordial pain: Secondary | ICD-10-CM | POA: Insufficient documentation

## 2022-04-12 MED ORDER — NITROGLYCERIN 0.4 MG SL SUBL
SUBLINGUAL_TABLET | SUBLINGUAL | Status: AC
  Filled 2022-04-12: qty 2

## 2022-04-12 MED ORDER — IOHEXOL 350 MG/ML SOLN
100.0000 mL | Freq: Once | INTRAVENOUS | Status: AC | PRN
  Administered 2022-04-12: 100 mL via INTRAVENOUS

## 2022-04-12 MED ORDER — NITROGLYCERIN 0.4 MG SL SUBL
0.8000 mg | SUBLINGUAL_TABLET | Freq: Once | SUBLINGUAL | Status: AC
  Administered 2022-04-12: 0.8 mg via SUBLINGUAL

## 2022-04-18 DIAGNOSIS — S233XXA Sprain of ligaments of thoracic spine, initial encounter: Secondary | ICD-10-CM | POA: Diagnosis not present

## 2022-04-18 DIAGNOSIS — S338XXA Sprain of other parts of lumbar spine and pelvis, initial encounter: Secondary | ICD-10-CM | POA: Diagnosis not present

## 2022-04-18 DIAGNOSIS — M9902 Segmental and somatic dysfunction of thoracic region: Secondary | ICD-10-CM | POA: Diagnosis not present

## 2022-04-18 DIAGNOSIS — S134XXA Sprain of ligaments of cervical spine, initial encounter: Secondary | ICD-10-CM | POA: Diagnosis not present

## 2022-04-18 DIAGNOSIS — M9901 Segmental and somatic dysfunction of cervical region: Secondary | ICD-10-CM | POA: Diagnosis not present

## 2022-04-18 DIAGNOSIS — M9903 Segmental and somatic dysfunction of lumbar region: Secondary | ICD-10-CM | POA: Diagnosis not present

## 2022-04-27 DIAGNOSIS — M9903 Segmental and somatic dysfunction of lumbar region: Secondary | ICD-10-CM | POA: Diagnosis not present

## 2022-04-27 DIAGNOSIS — S233XXA Sprain of ligaments of thoracic spine, initial encounter: Secondary | ICD-10-CM | POA: Diagnosis not present

## 2022-04-27 DIAGNOSIS — S134XXA Sprain of ligaments of cervical spine, initial encounter: Secondary | ICD-10-CM | POA: Diagnosis not present

## 2022-04-27 DIAGNOSIS — M9901 Segmental and somatic dysfunction of cervical region: Secondary | ICD-10-CM | POA: Diagnosis not present

## 2022-04-27 DIAGNOSIS — M9902 Segmental and somatic dysfunction of thoracic region: Secondary | ICD-10-CM | POA: Diagnosis not present

## 2022-04-27 DIAGNOSIS — S338XXA Sprain of other parts of lumbar spine and pelvis, initial encounter: Secondary | ICD-10-CM | POA: Diagnosis not present

## 2022-05-12 DIAGNOSIS — M9902 Segmental and somatic dysfunction of thoracic region: Secondary | ICD-10-CM | POA: Diagnosis not present

## 2022-05-12 DIAGNOSIS — S233XXA Sprain of ligaments of thoracic spine, initial encounter: Secondary | ICD-10-CM | POA: Diagnosis not present

## 2022-05-12 DIAGNOSIS — M9901 Segmental and somatic dysfunction of cervical region: Secondary | ICD-10-CM | POA: Diagnosis not present

## 2022-05-12 DIAGNOSIS — S338XXA Sprain of other parts of lumbar spine and pelvis, initial encounter: Secondary | ICD-10-CM | POA: Diagnosis not present

## 2022-05-12 DIAGNOSIS — S134XXA Sprain of ligaments of cervical spine, initial encounter: Secondary | ICD-10-CM | POA: Diagnosis not present

## 2022-05-12 DIAGNOSIS — M9903 Segmental and somatic dysfunction of lumbar region: Secondary | ICD-10-CM | POA: Diagnosis not present

## 2022-06-21 DIAGNOSIS — Z780 Asymptomatic menopausal state: Secondary | ICD-10-CM | POA: Diagnosis not present

## 2022-07-19 DIAGNOSIS — B079 Viral wart, unspecified: Secondary | ICD-10-CM | POA: Diagnosis not present

## 2022-07-19 DIAGNOSIS — L814 Other melanin hyperpigmentation: Secondary | ICD-10-CM | POA: Diagnosis not present

## 2022-07-19 DIAGNOSIS — D485 Neoplasm of uncertain behavior of skin: Secondary | ICD-10-CM | POA: Diagnosis not present

## 2022-07-19 DIAGNOSIS — L578 Other skin changes due to chronic exposure to nonionizing radiation: Secondary | ICD-10-CM | POA: Diagnosis not present

## 2022-11-08 DIAGNOSIS — Z1231 Encounter for screening mammogram for malignant neoplasm of breast: Secondary | ICD-10-CM | POA: Diagnosis not present

## 2022-11-22 DIAGNOSIS — J61 Pneumoconiosis due to asbestos and other mineral fibers: Secondary | ICD-10-CM | POA: Diagnosis not present

## 2022-11-22 DIAGNOSIS — E039 Hypothyroidism, unspecified: Secondary | ICD-10-CM | POA: Diagnosis not present

## 2022-11-22 DIAGNOSIS — I10 Essential (primary) hypertension: Secondary | ICD-10-CM | POA: Diagnosis not present

## 2022-11-22 DIAGNOSIS — D518 Other vitamin B12 deficiency anemias: Secondary | ICD-10-CM | POA: Diagnosis not present

## 2022-11-22 DIAGNOSIS — E559 Vitamin D deficiency, unspecified: Secondary | ICD-10-CM | POA: Diagnosis not present

## 2022-11-22 DIAGNOSIS — R739 Hyperglycemia, unspecified: Secondary | ICD-10-CM | POA: Diagnosis not present

## 2022-11-22 DIAGNOSIS — Z0001 Encounter for general adult medical examination with abnormal findings: Secondary | ICD-10-CM | POA: Diagnosis not present

## 2023-02-01 DIAGNOSIS — E042 Nontoxic multinodular goiter: Secondary | ICD-10-CM | POA: Diagnosis not present

## 2023-05-01 ENCOUNTER — Encounter: Payer: Self-pay | Admitting: *Deleted

## 2023-05-23 ENCOUNTER — Telehealth: Payer: Self-pay | Admitting: *Deleted

## 2023-05-23 NOTE — Telephone Encounter (Signed)
  Procedure: Colonoscopy  Height: 5'6 Weight: 170lbs        Have you had a colonoscopy before?  05/2020, Dr. Cindie  Do you have family history of colon cancer?  yes  Do you have a family history of polyps? no  Previous colonoscopy with polyps removed? yes  Do you have a history colorectal cancer?   no  Are you diabetic?  no  Do you have a prosthetic or mechanical heart valve? no  Do you have a pacemaker/defibrillator?   no  Have you had endocarditis/atrial fibrillation?  no  Do you use supplemental oxygen/CPAP?  no  Have you had joint replacement within the last 12 months?  no  Do you tend to be constipated or have to use laxatives?  no   Do you have history of alcohol use? If yes, how much and how often.  no  Do you have history or are you using drugs? If yes, what do are you  using?  no  Have you ever had a stroke/heart attack?  no  Have you ever had a heart or other vascular stent placed,?no  Do you take weight loss medication? no  female patients,: have you had a hysterectomy? no                              are you post menopausal?                                do you still have your menstrual cycle? no    Date of last menstrual period? 2008  Do you take any blood-thinning medications such as: (Plavix, aspirin, Coumadin, Aggrenox, Brilinta, Xarelto, Eliquis, Pradaxa, Savaysa or Effient)? no  If yes we need the name, milligram, dosage and who is prescribing doctor:               Current Outpatient Medications  Medication Sig Dispense Refill   cetirizine (ZYRTEC) 10 MG tablet Take 10 mg by mouth daily.     Coenzyme Q10 100 MG capsule Take 100 mg by mouth daily.     losartan (COZAAR) 100 MG tablet Take 100 mg by mouth daily.     rosuvastatin (CRESTOR) 10 MG tablet Take 10 mg by mouth at bedtime.     No current facility-administered medications for this visit.    Allergies  Allergen Reactions   Sulfa Antibiotics Rash

## 2023-06-14 NOTE — Telephone Encounter (Signed)
2022 colonoscopy with one 12 mm polyp in ascending colon. Tubular adenoma.   ASA 2 appropriate.

## 2023-06-15 NOTE — Telephone Encounter (Signed)
CALLED, no answer and not able to leave VM

## 2023-07-06 ENCOUNTER — Telehealth: Payer: Self-pay | Admitting: Internal Medicine

## 2023-07-06 NOTE — Telephone Encounter (Signed)
Pt LMOM that she was calling to schedule colonoscopy. [phone]

## 2023-07-07 ENCOUNTER — Encounter: Payer: Self-pay | Admitting: *Deleted

## 2023-07-07 ENCOUNTER — Encounter: Payer: Self-pay | Admitting: Cardiovascular Disease

## 2023-07-07 ENCOUNTER — Other Ambulatory Visit: Payer: Self-pay | Admitting: *Deleted

## 2023-07-07 ENCOUNTER — Ambulatory Visit: Payer: Medicare Other | Attending: Cardiovascular Disease | Admitting: Cardiovascular Disease

## 2023-07-07 VITALS — BP 120/68 | HR 65 | Ht 66.0 in | Wt 179.4 lb

## 2023-07-07 DIAGNOSIS — E7800 Pure hypercholesterolemia, unspecified: Secondary | ICD-10-CM

## 2023-07-07 DIAGNOSIS — Z8709 Personal history of other diseases of the respiratory system: Secondary | ICD-10-CM | POA: Diagnosis not present

## 2023-07-07 DIAGNOSIS — I2510 Atherosclerotic heart disease of native coronary artery without angina pectoris: Secondary | ICD-10-CM | POA: Diagnosis not present

## 2023-07-07 DIAGNOSIS — Z8774 Personal history of (corrected) congenital malformations of heart and circulatory system: Secondary | ICD-10-CM

## 2023-07-07 DIAGNOSIS — E663 Overweight: Secondary | ICD-10-CM

## 2023-07-07 DIAGNOSIS — I10 Essential (primary) hypertension: Secondary | ICD-10-CM

## 2023-07-07 DIAGNOSIS — R9431 Abnormal electrocardiogram [ECG] [EKG]: Secondary | ICD-10-CM

## 2023-07-07 LAB — LIPID PANEL
Chol/HDL Ratio: 2 {ratio} (ref 0.0–4.4)
Cholesterol, Total: 146 mg/dL (ref 100–199)
HDL: 73 mg/dL (ref >39)
LDL Chol Calc (NIH): 60 mg/dL (ref 0–99)
Triglycerides: 66 mg/dL (ref 0–149)
VLDL Cholesterol Cal: 13 mg/dL (ref 5–40)

## 2023-07-07 MED ORDER — PEG 3350-KCL-NA BICARB-NACL 420 G PO SOLR: 4000.0000 mL | Freq: Once | ORAL | 0 refills | Status: AC

## 2023-07-07 NOTE — Telephone Encounter (Signed)
Pt has been scheduled for 07/25/23 with Dr.Carver. instructions sent via mychart. Prep sent to the pharmacy

## 2023-07-07 NOTE — Progress Notes (Signed)
error 

## 2023-07-07 NOTE — Patient Instructions (Signed)
Medication Instructions:  NO CHANGES  *If you need a refill on your cardiac medications before your next appointment, please call your pharmacy*   Lab Work: Lipid Panel today   If you have labs (blood work) drawn today and your tests are completely normal, you will receive your results only by: MyChart Message (if you have MyChart) OR A paper copy in the mail If you have any lab test that is abnormal or we need to change your treatment, we will call you to review the results.    Follow-Up: At [facility], you and your health needs are our priority.  As part of our continuing mission to provide you with exceptional heart care, we have created designated Provider Care Teams.  These Care Teams include your primary Cardiologist (physician) and Advanced Practice Providers (APPs -  Physician Assistants and Nurse Practitioners) who all work together to provide you with the care you need, when you need it.  We recommend signing up for the patient portal called "MyChart".  Sign up information is provided on this After Visit Summary.  MyChart is used to connect with patients for Virtual Visits (Telemedicine).  Patients are able to view lab/test results, encounter notes, upcoming appointments, etc.  Non-urgent messages can be sent to your provider as well.   To learn more about what you can do with MyChart, go to [email or web address].    Your next appointment:   12 months with Dr. Royann Shivers    1st Floor: - Lobby - Registration  - Pharmacy  - Lab - Cafe  2nd Floor: - PV Lab - Diagnostic Testing (echo, CT, nuclear med)  3rd Floor: - Vacant  4th Floor: - TCTS (cardiothoracic surgery) - AFib Clinic - Structural Heart Clinic - Vascular Surgery  - Vascular Ultrasound  5th Floor: - HeartCare Cardiology (general and EP) - Clinical Pharmacy for coumadin, hypertension, lipid, weight-loss medications, and med management appointments    Valet parking services will be  available as well.

## 2023-07-07 NOTE — Progress Notes (Signed)
Cardiology Consult Note:    Date:  07/07/2023   ID:  Alexandria Peters, DOB 10/03/1950, MRN [id number]  PCP:  Elfredia Nevins, MD  [facility] Cardiologist:  Thurmon Fair, MD  [facility] Electrophysiologist:  None   Referring MD: Shawnie Dapper, PA-C   CC: Chest pain    History of Present Illness:    Alexandria Peters is a 73 y.o. 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  She is doing very well from a cardiovascular point of view.  She has occasional chest discomfort while lying in bed but this does not occur when she goes on daily 2 mile walks with her neighbor.  She denies shortness of breath at rest or with activity, palpitations, dizziness, syncope, lower extremity edema, claudication or focal neurological complaints.  Continues have some difficulty with discomfort in her left shoulder which is much weaker than her right and occasionally hurts.  Her coronary CT angiogram did not show any significant stenosis.  Incidental note was made of a 3 mm nodule in the lingula that is not present on follow-up imaging performed for her asbestosis.  Cholesterol profile is markedly improved after starting treatment statin.  She is always had an excellent HDL but now her LDL is under 70.   Ms. 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 She denies any difficulty breathing associated with this.  She has never smoked.  History of asbestosis for which patient follows with Dr. [name] (he is retiring and she will have a new specialist soon).  Patient states she had worked in [employer] that led to her work exposure.   Past Medical History:  Diagnosis Date   Elevated LDL cholesterol level 2013   mild   Seasonal allergies    SOB (shortness of breath)    Systemic hypertension    Current Medications: Current Meds  Medication Sig   Biotin 5000 MCG CAPS Take 5,000 mcg by mouth daily at 6 (six) AM.   cetirizine (ZYRTEC) 10 MG tablet Take 10 mg by mouth daily.   Cholecalciferol (D3 PO) Take 2,000 mcg by mouth daily at 12 noon.   Coenzyme Q10 100 MG capsule Take 100 mg by mouth daily.   losartan (COZAAR) 100 MG tablet Take 100 mg by mouth daily.   RESTASIS 0.05 % ophthalmic emulsion Place 1 drop into both eyes 2 (two) times daily.   rosuvastatin (CRESTOR) 10 MG tablet Take 10 mg by mouth at bedtime.    Allergies:   Sulfa antibiotics   Social History   Socioeconomic History   Marital status: Divorced    Spouse name: Not on file   Number of children: Not on file   Years of education: Not on file   Highest education level: Not on file  Occupational History   Not on file  Tobacco Use   Smoking status: Never   Smokeless tobacco: Never  Vaping Use   Vaping status: Never Used  Substance and Sexual Activity   Alcohol use: Not Currently   Drug use: Never   Sexual activity: Not Currently  Other Topics Concern   Not  on file  Social History Narrative   Not on file   Social Drivers of Health   Financial Resource Strain: Low Risk  (01/31/2023)   Received from [facility]   Overall Financial Resource Strain (CARDIA)    Difficulty of Paying Living Expenses: Not hard at all  Food Insecurity: No Food Insecurity (01/31/2023)   Received from [facility]   Hunger Vital Sign    Worried About Running Out of Food in the Last Year: Never true    Ran Out of Food in the Last Year: Never true  Transportation Needs: No Transportation Needs (01/31/2023)   Received from [facility] - Transportation     Lack of Transportation (Medical): No    Lack of Transportation (Non-Medical): No  Physical Activity: Sufficiently Active (01/31/2023)   Received from [facility]   Exercise Vital Sign    Days of Exercise per Week: 5 days    Minutes of Exercise per Session: 50 min  Stress: No Stress Concern Present (01/31/2023)   Received from [facility] - Occupational Stress Questionnaire    Feeling of Stress : Not at all  Social Connections: Socially Integrated (01/31/2023)   Received from [facility]   Social Network    How would you rate your social network (family, work, friends)?: Good participation with social networks    Family History: Mother: Stroke at the age of 30.  Father: MI, CAD  Brother: Pancreatitic cancer Brother: CAD with stenting   Two other brothers without health conditions noted.   ROS:   Please see the history of present illness.     All other systems reviewed and are negative.  EKGs/Labs/Other Studies Reviewed:    EKG:    EKG Interpretation Date/Time:  Friday July 07 2023 08:37:29 EST Ventricular Rate:  65 PR Interval:  172 QRS Duration:  72 QT Interval:  438 QTC Calculation: 455 R Axis:   -20  Text Interpretation: Normal sinus rhythm Low voltage QRS Possible Anterolateral infarct (cited on or before 21-Mar-2008) When compared with ECG of 21-Mar-2008 10:14, No significant change was found Confirmed by Fard Borunda ([id number]) on 07/07/2023 9:02:33 AM         Recent Labs: No results found for requested labs within last 365 days.  10/09/2019 K 5.0,Hgb 13.2, glucose 94, creatinine 0.9 Recent Lipid Panel    Component Value Date/Time   CHOL 172 03/29/2022 1228   TRIG 100 03/29/2022 1228   HDL 77 03/29/2022 1228   CHOLHDL 2.2 03/29/2022 1228   LDLCALC 77 03/29/2022 1228   10/09/2019 CHOL 197, TG 132, LDL 111, HDL 63   10/22/2020 Cholesterol 223, HDL 75, LDL 133, triglycerides 87 Hemoglobin 13.8,  creatinine 0.94 potassium 4.7, normal liver function test  11/13/2021 Cholesterol 225, HDL 71, LDL 131, triglycerides 131, Hemoglobin 12.7, creatinine 0.5, potassium 4.6, ALT 19 Physical Exam:    VS:  BP 120/68 (BP Location: Left Arm, Patient Position: Sitting)   Pulse 65   Ht 5\' 6"  (1.676 m)   Wt 179 lb 6.4 oz (81.4 kg)   SpO2 97%   BMI 28.96 kg/m     Wt Readings from Last 3 Encounters:  07/07/23 179 lb 6.4 oz (81.4 kg)  03/29/22 186 lb (84.4 kg)  01/19/21 178 lb 6.4 oz (80.9 kg)      General: Alert, oriented x3, no distress, borderline obese Head: no evidence of trauma, PERRL, EOMI, no exophtalmos or lid lag, no myxedema, no  xanthelasma; normal ears, nose and oropharynx Neck: normal jugular venous pulsations and no hepatojugular reflux; brisk carotid pulses without delay and no carotid bruits Chest: Has a strange whooping wheezing sound in her right lung and some pleural rubs,, no signs of consolidation by percussion or palpation, normal fremitus, symmetrical and full respiratory excursions Cardiovascular: normal position and quality of the apical impulse, regular rhythm, normal first and second heart sounds, no murmurs, rubs or gallops Abdomen: no tenderness or distention, no masses by palpation, no abnormal pulsatility or arterial bruits, normal bowel sounds, no hepatosplenomegaly Extremities: no clubbing, cyanosis or edema; 2+ radial, ulnar and brachial pulses bilaterally; 2+ right femoral, posterior tibial and dorsalis pedis pulses; 2+ left femoral, posterior tibial and dorsalis pedis pulses; no subclavian or femoral bruits Neurological: grossly nonfocal Psych: Normal mood and affect    ASSESSMENT:    1. Coronary artery disease involving native coronary artery of native heart without angina pectoris   2. Essential hypertension   3. Hypercholesterolemia   4. Status post pericardial cyst excision   5. Overweight   6. History of asbestosis   7. Nonspecific abnormal  electrocardiogram (ECG) (EKG)      PLAN:    In order of problems listed above:  CAD: No significant stenoses on coronary CT angio Nov 2023, but elevated calcium score (81st %ile).  Patient had Myoview stress test in 2013 that did not show ischemia.  Low risk treadmill stress test in 2021. Chest discomfort at rest may be due to GERD/hiatal hernia. HTN: very well controlled. HLP: target LDL less than 70, recheck today. Pericardial Cyst: Resolved surgically. Previous removal in 2009 complicated by right phrenic nerve damage. CT imaging shows compressive atelectasis due to hemidiaphragmatic paralysis.  Echocardiogram 2021 shows no evidence of recurrent pericardial cyst, pericardial thickening or calcification or hemodynamics consistent with constriction. Overweight: has lost some weight. Would try to lose another 10 lb or so over the next year. Asbestosis: Due to work exposure. Follows with Dr. [name], but he is retiring. She will call us when she knows who her next specialist will be. Had a 3 mm lingula nodule reported on CT from Nov 2023, but not seen at outside follow up. Unlikely to be clinically relevant. Never smoker. Abnormal ECG: low voltage, but no other reasons to suspect amyloidosis. Chronic nonspecific anterior T wave changes.  Medication Adjustments/Labs and Tests Ordered: Current medicines are reviewed at length with the patient today.  Concerns regarding medicines are outlined above.  Orders Placed This Encounter  Procedures   Lipid panel   EKG 12-Lead    No orders of the defined types were placed in this encounter.    Patient Instructions  Medication Instructions:  NO CHANGES  *If you need a refill on your cardiac medications before your next appointment, please call your pharmacy*   Lab Work: Lipid Panel today   If you have labs (blood work) drawn today and your tests are completely normal, you will receive your results only by: MyChart Message (if you have  MyChart) OR A paper copy in the mail If you have any lab test that is abnormal or we need to change your treatment, we will call you to review the results.    Follow-Up: At [facility], you and your health needs are our priority.  As part of our continuing mission to provide you with exceptional heart care, we have created designated Provider Care Teams.  These Care Teams include your primary Cardiologist (physician) and Advanced Practice Providers (  APPs -  Physician Assistants and Nurse Practitioners) who all work together to provide you with the care you need, when you need it.  We recommend signing up for the patient portal called "MyChart".  Sign up information is provided on this After Visit Summary.  MyChart is used to connect with patients for Virtual Visits (Telemedicine).  Patients are able to view lab/test results, encounter notes, upcoming appointments, etc.  Non-urgent messages can be sent to your provider as well.   To learn more about what you can do with MyChart, go to [email or web address].    Your next appointment:   12 months with Dr. Royann Shivers    1st Floor: - Lobby - Registration  - Pharmacy  - Lab - Cafe  2nd Floor: - PV Lab - Diagnostic Testing (echo, CT, nuclear med)  3rd Floor: - Vacant  4th Floor: - TCTS (cardiothoracic surgery) - AFib Clinic - Structural Heart Clinic - Vascular Surgery  - Vascular Ultrasound  5th Floor: - HeartCare Cardiology (general and EP) - Clinical Pharmacy for coumadin, hypertension, lipid, weight-loss medications, and med management appointments    Valet parking services will be available as well.      Thurmon Fair, MD, [facility] [phone] office [phone] pager

## 2023-07-07 NOTE — Telephone Encounter (Signed)
Pt has been scheduled for 07/25/23 with Dr.Carver. instructions sent via mychart and prep sent to the pharmacy

## 2023-07-08 ENCOUNTER — Encounter: Payer: Self-pay | Admitting: Cardiovascular Disease

## 2023-07-10 NOTE — Telephone Encounter (Signed)
 Questionnaire from recall, no referral needed

## 2023-07-25 ENCOUNTER — Ambulatory Visit (HOSPITAL_COMMUNITY): Admitting: Anesthesiology

## 2023-07-25 ENCOUNTER — Ambulatory Visit (HOSPITAL_COMMUNITY)
Admission: RE | Admit: 2023-07-25 | Discharge: 2023-07-25 | Disposition: A | Discharge status: Home / Self Care | Payer: Medicare Other | Attending: Internal Medicine | Admitting: Internal Medicine

## 2023-07-25 ENCOUNTER — Encounter (HOSPITAL_COMMUNITY)
Admission: RE | Disposition: A | Discharge status: Home / Self Care | Payer: Self-pay | Source: Home / Self Care | Attending: Internal Medicine

## 2023-07-25 ENCOUNTER — Encounter (HOSPITAL_COMMUNITY): Payer: Self-pay | Admitting: Internal Medicine

## 2023-07-25 ENCOUNTER — Other Ambulatory Visit: Payer: Self-pay

## 2023-07-25 DIAGNOSIS — Z1211 Encounter for screening for malignant neoplasm of colon: Secondary | ICD-10-CM | POA: Insufficient documentation

## 2023-07-25 DIAGNOSIS — K5730 Diverticulosis of large intestine without perforation or abscess without bleeding: Secondary | ICD-10-CM

## 2023-07-25 DIAGNOSIS — D124 Benign neoplasm of descending colon: Secondary | ICD-10-CM

## 2023-07-25 DIAGNOSIS — D123 Benign neoplasm of transverse colon: Secondary | ICD-10-CM | POA: Diagnosis not present

## 2023-07-25 DIAGNOSIS — K635 Polyp of colon: Secondary | ICD-10-CM | POA: Diagnosis not present

## 2023-07-25 DIAGNOSIS — I10 Essential (primary) hypertension: Secondary | ICD-10-CM | POA: Diagnosis not present

## 2023-07-25 DIAGNOSIS — Z860100 Personal history of colon polyps, unspecified: Secondary | ICD-10-CM | POA: Diagnosis not present

## 2023-07-25 DIAGNOSIS — I2510 Atherosclerotic heart disease of native coronary artery without angina pectoris: Secondary | ICD-10-CM | POA: Insufficient documentation

## 2023-07-25 HISTORY — PX: COLONOSCOPY WITH PROPOFOL: SHX5780

## 2023-07-25 HISTORY — PX: POLYPECTOMY: SHX5525

## 2023-07-25 SURGERY — COLONOSCOPY WITH PROPOFOL
Anesthesia: General

## 2023-07-25 MED ORDER — LIDOCAINE HCL (CARDIAC) PF 100 MG/5ML IV SOSY
PREFILLED_SYRINGE | INTRAVENOUS | Status: DC | PRN
  Administered 2023-07-25: 60 mg via INTRATRACHEAL

## 2023-07-25 MED ORDER — LIDOCAINE HCL (PF) 2 % IJ SOLN
INTRAMUSCULAR | Status: AC
  Filled 2023-07-25: qty 5

## 2023-07-25 MED ORDER — PHENYLEPHRINE 80 MCG/ML (10ML) SYRINGE FOR IV PUSH (FOR BLOOD PRESSURE SUPPORT)
PREFILLED_SYRINGE | INTRAVENOUS | Status: AC
  Filled 2023-07-25: qty 10

## 2023-07-25 MED ORDER — PROPOFOL 10 MG/ML IV BOLUS
INTRAVENOUS | Status: DC | PRN
  Administered 2023-07-25: 80 mg via INTRAVENOUS
  Administered 2023-07-25: 30 mg via INTRAVENOUS

## 2023-07-25 MED ORDER — LACTATED RINGERS IV SOLN: INTRAVENOUS | Status: DC

## 2023-07-25 MED ORDER — PHENYLEPHRINE 80 MCG/ML (10ML) SYRINGE FOR IV PUSH (FOR BLOOD PRESSURE SUPPORT)
PREFILLED_SYRINGE | INTRAVENOUS | Status: DC | PRN
  Administered 2023-07-25 (×2): 160 ug via INTRAVENOUS

## 2023-07-25 MED ORDER — PROPOFOL 500 MG/50ML IV EMUL
INTRAVENOUS | Status: DC | PRN
  Administered 2023-07-25: 125 ug/kg/min via INTRAVENOUS

## 2023-07-25 NOTE — Transfer of Care (Addendum)
 Immediate Anesthesia Transfer of Care Note  Patient: Alexandria Peters  Procedure(s) Performed: COLONOSCOPY WITH PROPOFOL POLYPECTOMY  Patient Location: Endoscopy Unit  Anesthesia Type:General  Level of Consciousness: drowsy and patient cooperative  Airway & Oxygen Therapy: Patient Spontanous Breathing and Patient connected to nasal cannula oxygen  Post-op Assessment: Report given to RN and Post -op Vital signs reviewed and stable  Post vital signs: Reviewed and stable  Last Vitals:  Vitals Value Taken Time  BP 118/60 07/25/23   1212  Temp 36.6 07/25/23   1212  Pulse 70 07/25/23   1212  Resp 17 07/25/23   1212  SpO2 99% 07/25/23   1212    Last Pain:  Vitals:   07/25/23 1147  TempSrc:   PainSc: 0-No pain      Patients Stated Pain Goal: 8 (07/25/23 1052)  Complications: No notable events documented.

## 2023-07-25 NOTE — Discharge Instructions (Addendum)
  Colonoscopy Discharge Instructions  Read the instructions outlined below and refer to this sheet in the next few weeks. These discharge instructions provide you with general information on caring for yourself after you leave the hospital. Your doctor may also give you specific instructions. While your treatment has been planned according to the most current medical practices available, unavoidable complications occasionally occur.   ACTIVITY You may resume your regular activity, but move at a slower pace for the next 24 hours.  Take frequent rest periods for the next 24 hours.  Walking will help get rid of the air and reduce the bloated feeling in your belly (abdomen).  No driving for 24 hours (because of the medicine (anesthesia) used during the test).   Do not sign any important legal documents or operate any machinery for 24 hours (because of the anesthesia used during the test).  NUTRITION Drink plenty of fluids.  You may resume your normal diet as instructed by your doctor.  Begin with a light meal and progress to your normal diet. Heavy or fried foods are harder to digest and may make you feel sick to your stomach (nauseated).  Avoid alcoholic beverages for 24 hours or as instructed.  MEDICATIONS You may resume your normal medications unless your doctor tells you otherwise.  WHAT YOU CAN EXPECT TODAY Some feelings of bloating in the abdomen.  Passage of more gas than usual.  Spotting of blood in your stool or on the toilet paper.  IF YOU HAD POLYPS REMOVED DURING THE COLONOSCOPY: No aspirin products for 7 days or as instructed.  No alcohol for 7 days or as instructed.  Eat a soft diet for the next 24 hours.  FINDING OUT THE RESULTS OF YOUR TEST Not all test results are available during your visit. If your test results are not back during the visit, make an appointment with your caregiver to find out the results. Do not assume everything is normal if you have not heard from your  caregiver or the medical facility. It is important for you to follow up on all of your test results.  SEEK IMMEDIATE MEDICAL ATTENTION IF: You have more than a spotting of blood in your stool.  Your belly is swollen (abdominal distention).  You are nauseated or vomiting.  You have a temperature over 101.  You have abdominal pain or discomfort that is severe or gets worse throughout the day.   Your colonoscopy revealed 2 small polyp(s) which I removed successfully. Await pathology results, my office will contact you. I recommend repeating colonoscopy in 5 years for surveillance purposes.   You also have diverticulosis I would recommend increasing fiber in your diet or adding OTC Benefiber/Metamucil. Be sure to drink at least 4 to 6 glasses of water daily. Follow-up with GI as needed.  I hope you have a great rest of your week!  Hennie Duos. Marletta Lor, D.O. Gastroenterology and Hepatology [facility]

## 2023-07-25 NOTE — Op Note (Signed)
 [facility] Patient Name: Alexandria Peters Procedure Date: 07/25/2023 11:39 AM MRN: [id number] Date of Birth: 1951/03/28 Attending MD: Hennie Duos. Marletta Lor , [place], [id number] CSN: [id number] Age: 73 Admit Type: Outpatient Procedure:                Colonoscopy Indications:              Surveillance: Personal history of adenomatous                            polyps on last colonoscopy 3 years ago Providers:                Hennie Duos. Marletta Lor, DO, Crystal Page, Lennice Sites                            Technician, Technician Referring MD:              Medicines:                See the Anesthesia note for documentation of the                            administered medications Complications:            No immediate complications. Estimated Blood Loss:     Estimated blood loss was minimal. Procedure:                Pre-Anesthesia Assessment:                           - The anesthesia plan was to use monitored                            anesthesia care (MAC).                           After obtaining informed consent, the colonoscope                            was passed under direct vision. Throughout the                            procedure, the patient's blood pressure, pulse, and                            oxygen saturations were monitored continuously. The                            [id number]-[id number] ([id number]) was introduced through the                            anus and advanced to the the cecum, identified by                            appendiceal orifice and ileocecal valve. The                            colonoscopy was performed without difficulty.  The                            patient tolerated the procedure well. The quality                            of the bowel preparation was evaluated using the                            BBPS [facility] Bowel Preparation Scale) with scores                            of: Right Colon = 3, Transverse Colon = 3 and Left                            Colon = 3  (entire mucosa seen well with no residual                            staining, small fragments of stool or opaque                            liquid). The total BBPS score equals 9. Scope In: 11:51:59 AM Scope Out: 12:07:29 PM Scope Withdrawal Time: 0 hours 11 minutes 36 seconds  Total Procedure Duration: 0 hours 15 minutes 30 seconds  Findings:      Scattered small-mouthed diverticula were found in the sigmoid colon,       transverse colon and ascending colon.      Two sessile polyps were found in the descending colon and transverse       colon. The polyps were 4 to 5 mm in size. These polyps were removed with       a cold snare. Resection and retrieval were complete.      The exam was otherwise without abnormality. Impression:               - Diverticulosis in the sigmoid colon, in the                            transverse colon and in the ascending colon.                           - Two 4 to 5 mm polyps in the descending colon and                            in the transverse colon, removed with a cold snare.                            Resected and retrieved.                           - The examination was otherwise normal. Moderate Sedation:      Per Anesthesia Care Recommendation:           - Patient has a contact number available for  emergencies. The signs and symptoms of potential                            delayed complications were discussed with the                            patient. Return to normal activities tomorrow.                            Written discharge instructions were provided to the                            patient.                           - Resume previous diet.                           - Continue present medications.                           - Await pathology results.                           - Repeat colonoscopy in 5 years for surveillance.                           - Return to GI clinic PRN. Procedure Code(s):        ---  Professional ---                           [phone], Colonoscopy, flexible; with removal of                            tumor(s), polyp(s), or other lesion(s) by snare                            technique Diagnosis Code(s):        --- Professional ---                           Z86.010, Personal history of colonic polyps                           D12.4, Benign neoplasm of descending colon                           D12.3, Benign neoplasm of transverse colon (hepatic                            flexure or splenic flexure)                           K57.30, Diverticulosis of large intestine without                            perforation or abscess  without bleeding CPT copyright 2022 American Medical Association. All rights reserved. The codes documented in this report are preliminary and upon coder review may  be revised to meet current compliance requirements. Hennie Duos. Marletta Lor, DO Hennie Duos. Marletta Lor, DO 07/25/2023 12:12:32 PM This report has been signed electronically. Number of Addenda: 0

## 2023-07-25 NOTE — H&P (Signed)
 Primary Care Physician:  Elfredia Nevins, MD Primary Gastroenterologist:  Dr. Marletta Lor  Pre-Procedure History & Physical: HPI:  Alexandria Peters is a 73 y.o. female is here for a colonoscopy to be performed for surveillance purposes, personal history of adenomatous colon polyps in 2022  Past Medical History:  Diagnosis Date   Elevated LDL cholesterol level 2013   mild   Seasonal allergies    SOB (shortness of breath)    Systemic hypertension     Past Surgical History:  Procedure Laterality Date   COLONOSCOPY WITH PROPOFOL N/A 05/29/2020   Procedure: COLONOSCOPY WITH PROPOFOL;  Surgeon: Lanelle Bal, DO;  Location: [facility];  Service: Endoscopy;  Laterality: N/A;  8:30   pericardial cyst removal  2009   laparoscopic   POLYPECTOMY  05/29/2020   Procedure: POLYPECTOMY INTESTINAL;  Surgeon: Lanelle Bal, DO;  Location: [facility];  Service: Endoscopy;;    Prior to Admission medications   Medication Sig Start Date End Date Taking? Authorizing Provider  Biotin 5000 MCG CAPS Take 5,000 mcg by mouth daily at 6 (six) AM.   Yes [provider]  cetirizine (ZYRTEC) 10 MG tablet Take 10 mg by mouth daily.   Yes [provider]  Cholecalciferol (D3 PO) Take 2,000 mcg by mouth daily at 12 noon.   Yes [provider]  Coenzyme Q10 100 MG capsule Take 100 mg by mouth daily. 11/13/21  Yes [provider]  losartan (COZAAR) 100 MG tablet Take 100 mg by mouth daily.   Yes [provider]  RESTASIS 0.05 % ophthalmic emulsion Place 1 drop into both eyes 2 (two) times daily. 03/30/23  Yes [provider]  rosuvastatin (CRESTOR) 10 MG tablet Take 10 mg by mouth at bedtime.   Yes [provider]    Allergies as of 07/07/2023 - Review Complete 07/07/2023  Allergen Reaction Noted   Sulfa antibiotics Rash     Family History  Problem Relation Age of Onset   CAD Other        FMH, premature cad   Heart attack Father         early 77's   Angina Brother 3       with blockages   Colon cancer Maternal Aunt     Social History   Socioeconomic History   Marital status: Divorced    Spouse name: Not on file   Number of children: Not on file   Years of education: Not on file   Highest education level: Not on file  Occupational History   Not on file  Tobacco Use   Smoking status: Never   Smokeless tobacco: Never  Vaping Use   Vaping status: Never Used  Substance and Sexual Activity   Alcohol use: Not Currently   Drug use: Never   Sexual activity: Not Currently  Other Topics Concern   Not on file  Social History Narrative   Not on file   Social Drivers of Health   Financial Resource Strain: Low Risk  (01/31/2023)   Received from [facility]   Overall Financial Resource Strain (CARDIA)    Difficulty of Paying Living Expenses: Not hard at all  Food Insecurity: No Food Insecurity (01/31/2023)   Received from [facility]   Hunger Vital Sign    Worried About Running Out of Food in the Last Year: Never true    Ran Out of Food in the Last Year: Never true  Transportation Needs: No Transportation Needs (01/31/2023)  Received from [facility] - Transportation    Lack of Transportation (Medical): No    Lack of Transportation (Non-Medical): No  Physical Activity: Sufficiently Active (01/31/2023)   Received from [facility]   Exercise Vital Sign    Days of Exercise per Week: 5 days    Minutes of Exercise per Session: 50 min  Stress: No Stress Concern Present (01/31/2023)   Received from [facility] - Occupational Stress Questionnaire    Feeling of Stress : Not at all  Social Connections: Socially Integrated (01/31/2023)   Received from [facility]   Social Network    How would you rate your social network (family, work, friends)?: Good participation with social networks  Intimate Partner Violence: Not At Risk (01/31/2023)   Received from  [facility]   HITS    Over the last 12 months how often did your partner physically hurt you?: Never    Over the last 12 months how often did your partner insult you or talk down to you?: Never    Over the last 12 months how often did your partner threaten you with physical harm?: Never    Over the last 12 months how often did your partner scream or curse at you?: Never    Review of Systems: See HPI, otherwise negative ROS  Physical Exam: Vital signs in last 24 hours: Temp:  [97.9 F (36.6 C)] 97.9 F (36.6 C) (03/11 1052) Pulse Rate:  [77] 77 (03/11 1052) Resp:  [18] 18 (03/11 1052) BP: (139)/(68) 139/68 (03/11 1052) SpO2:  [94 %] 94 % (03/11 1052)   General:   Alert,  Well-developed, well-nourished, pleasant and cooperative in NAD Head:  Normocephalic and atraumatic. Eyes:  Sclera clear, no icterus.   Conjunctiva pink. Ears:  Normal auditory acuity. Nose:  No deformity, discharge,  or lesions. Msk:  Symmetrical without gross deformities. Normal posture. Extremities:  Without clubbing or edema. Neurologic:  Alert and  oriented x4;  grossly normal neurologically. Skin:  Intact without significant lesions or rashes. Psych:  Alert and cooperative. Normal mood and affect.  Impression/Plan: Alexandria Peters is here for a colonoscopy to be performed for surveillance purposes, personal history of adenomatous colon polyps in 2022  The risks of the procedure including infection, bleed, or perforation as well as benefits, limitations, alternatives and imponderables have been reviewed with the patient. Questions have been answered. All parties agreeable.

## 2023-07-25 NOTE — Anesthesia Preprocedure Evaluation (Signed)
 Anesthesia Evaluation  Patient identified by MRN, date of birth, ID band Patient awake    Reviewed: Allergy & Precautions, H&P , NPO status , Patient's Chart, lab work & pertinent test results, reviewed documented beta blocker date and time   Airway Mallampati: II  TM Distance: >3 FB Neck ROM: full    Dental no notable dental hx.    Pulmonary neg pulmonary ROS   Pulmonary exam normal breath sounds clear to auscultation       Cardiovascular Exercise Tolerance: Good hypertension, + CAD  negative cardio ROS  Rhythm:regular Rate:Normal     Neuro/Psych negative neurological ROS  negative psych ROS   GI/Hepatic negative GI ROS, Neg liver ROS,,,  Endo/Other  negative endocrine ROS    Renal/GU negative Renal ROS  negative genitourinary   Musculoskeletal   Abdominal   Peds  Hematology negative hematology ROS (+)   Anesthesia Other Findings   Reproductive/Obstetrics negative OB ROS                             Anesthesia Physical Anesthesia Plan  ASA: 2  Anesthesia Plan: General   Post-op Pain Management:    Induction:   PONV Risk Score and Plan: Propofol infusion  Airway Management Planned:   Additional Equipment:   Intra-op Plan:   Post-operative Plan:   Informed Consent: I have reviewed the patients History and Physical, chart, labs and discussed the procedure including the risks, benefits and alternatives for the proposed anesthesia with the patient or authorized representative who has indicated his/her understanding and acceptance.     Dental Advisory Given  Plan Discussed with: CRNA  Anesthesia Plan Comments:        Anesthesia Quick Evaluation

## 2023-07-26 ENCOUNTER — Encounter (HOSPITAL_COMMUNITY): Payer: Self-pay | Admitting: Internal Medicine

## 2023-07-26 LAB — SURGICAL PATHOLOGY

## 2023-07-29 NOTE — Anesthesia Postprocedure Evaluation (Signed)
 Anesthesia Post Note  Patient: Alexandria Peters  Procedure(s) Performed: COLONOSCOPY WITH PROPOFOL POLYPECTOMY  Patient location during evaluation: Phase II Anesthesia Type: General Level of consciousness: awake Pain management: pain level controlled Vital Signs Assessment: post-procedure vital signs reviewed and stable Respiratory status: spontaneous breathing and respiratory function stable Cardiovascular status: blood pressure returned to baseline and stable Postop Assessment: no headache and no apparent nausea or vomiting Anesthetic complications: no Comments: Late entry   No notable events documented.   Last Vitals:  Vitals:   07/25/23 1052 07/25/23 1212  BP: 139/68 118/60  Pulse: 77 67  Resp: 18 17  Temp: 36.6 C 36.6 C  SpO2: 94% 99%    Last Pain:  Vitals:   07/25/23 1212  TempSrc: Oral  PainSc: 0-No pain                 Windell Norfolk

## 2023-08-08 DIAGNOSIS — Z85828 Personal history of other malignant neoplasm of skin: Secondary | ICD-10-CM | POA: Diagnosis not present

## 2023-08-08 DIAGNOSIS — L814 Other melanin hyperpigmentation: Secondary | ICD-10-CM | POA: Diagnosis not present

## 2023-08-08 DIAGNOSIS — L821 Other seborrheic keratosis: Secondary | ICD-10-CM | POA: Diagnosis not present

## 2023-08-08 DIAGNOSIS — L578 Other skin changes due to chronic exposure to nonionizing radiation: Secondary | ICD-10-CM | POA: Diagnosis not present

## 2023-08-08 DIAGNOSIS — D225 Melanocytic nevi of trunk: Secondary | ICD-10-CM | POA: Diagnosis not present

## 2023-11-10 DIAGNOSIS — Z1231 Encounter for screening mammogram for malignant neoplasm of breast: Secondary | ICD-10-CM | POA: Diagnosis not present

## 2023-12-18 DIAGNOSIS — E559 Vitamin D deficiency, unspecified: Secondary | ICD-10-CM | POA: Diagnosis not present

## 2023-12-18 DIAGNOSIS — J61 Pneumoconiosis due to asbestos and other mineral fibers: Secondary | ICD-10-CM | POA: Diagnosis not present

## 2023-12-18 DIAGNOSIS — Z0001 Encounter for general adult medical examination with abnormal findings: Secondary | ICD-10-CM | POA: Diagnosis not present

## 2023-12-18 DIAGNOSIS — I10 Essential (primary) hypertension: Secondary | ICD-10-CM | POA: Diagnosis not present

## 2023-12-18 DIAGNOSIS — R7309 Other abnormal glucose: Secondary | ICD-10-CM | POA: Diagnosis not present

## 2023-12-18 DIAGNOSIS — Z9229 Personal history of other drug therapy: Secondary | ICD-10-CM | POA: Diagnosis not present

## 2024-08-14 ENCOUNTER — Ambulatory Visit: Admitting: Cardiovascular Disease
